# Patient Record
Sex: Male | Born: 1960
Health system: Southern US, Community
[De-identification: ages and names within clinical notes are randomized; demographics above are authoritative.]

## PROBLEM LIST (undated history)

## (undated) DIAGNOSIS — R42 Dizziness and giddiness: Secondary | ICD-10-CM

## (undated) DIAGNOSIS — S0990XA Unspecified injury of head, initial encounter: Secondary | ICD-10-CM

## (undated) DIAGNOSIS — F0781 Postconcussional syndrome: Secondary | ICD-10-CM

## (undated) DIAGNOSIS — Z789 Other specified health status: Secondary | ICD-10-CM

## (undated) DIAGNOSIS — E785 Hyperlipidemia, unspecified: Secondary | ICD-10-CM

## (undated) HISTORY — PX: NO PAST SURGERIES: SHX2092

## (undated) HISTORY — DX: Hyperlipidemia, unspecified: E78.5

## (undated) HISTORY — PX: KNEE SURGERY: SHX244

## (undated) HISTORY — DX: Other specified health status: Z78.9

---

## 1998-07-24 ENCOUNTER — Emergency Department (HOSPITAL_COMMUNITY): Admission: EM | Admit: 1998-07-24 | Discharge: 1998-07-24 | Payer: Self-pay | Admitting: Emergency Medicine

## 1999-05-17 ENCOUNTER — Emergency Department (HOSPITAL_COMMUNITY): Admission: EM | Admit: 1999-05-17 | Discharge: 1999-05-17 | Payer: Self-pay

## 2002-02-11 ENCOUNTER — Inpatient Hospital Stay (HOSPITAL_COMMUNITY): Admission: EM | Admit: 2002-02-11 | Discharge: 2002-02-14 | Payer: Self-pay | Admitting: Psychiatry

## 2002-02-11 ENCOUNTER — Encounter: Payer: Self-pay | Admitting: Emergency Medicine

## 2004-08-07 ENCOUNTER — Inpatient Hospital Stay (HOSPITAL_COMMUNITY): Admission: AC | Admit: 2004-08-07 | Discharge: 2004-08-10 | Payer: Self-pay

## 2004-09-02 ENCOUNTER — Ambulatory Visit (HOSPITAL_COMMUNITY): Admission: RE | Admit: 2004-09-02 | Discharge: 2004-09-02 | Payer: Self-pay | Admitting: General Surgery

## 2004-12-08 ENCOUNTER — Emergency Department (HOSPITAL_COMMUNITY): Admission: EM | Admit: 2004-12-08 | Discharge: 2004-12-08 | Payer: Self-pay | Admitting: Emergency Medicine

## 2005-07-22 ENCOUNTER — Emergency Department (HOSPITAL_COMMUNITY): Admission: EM | Admit: 2005-07-22 | Discharge: 2005-07-23 | Payer: Self-pay | Admitting: Emergency Medicine

## 2005-11-15 ENCOUNTER — Encounter: Admission: RE | Admit: 2005-11-15 | Discharge: 2006-02-13 | Payer: Self-pay | Admitting: Neurology

## 2006-09-25 ENCOUNTER — Emergency Department (HOSPITAL_COMMUNITY): Admission: EM | Admit: 2006-09-25 | Discharge: 2006-09-25 | Payer: Self-pay | Admitting: Emergency Medicine

## 2007-09-06 ENCOUNTER — Emergency Department (HOSPITAL_COMMUNITY): Admission: EM | Admit: 2007-09-06 | Discharge: 2007-09-06 | Payer: Self-pay | Admitting: Emergency Medicine

## 2008-01-20 ENCOUNTER — Encounter: Admission: RE | Admit: 2008-01-20 | Discharge: 2008-04-19 | Payer: Self-pay | Admitting: Psychology

## 2010-07-29 ENCOUNTER — Emergency Department (HOSPITAL_COMMUNITY)
Admission: EM | Admit: 2010-07-29 | Discharge: 2010-07-29 | Disposition: A | Discharge status: Home / Self Care | Payer: Medicare Other | Attending: Emergency Medicine | Admitting: Emergency Medicine

## 2010-07-29 DIAGNOSIS — T6391XA Toxic effect of contact with unspecified venomous animal, accidental (unintentional), initial encounter: Secondary | ICD-10-CM | POA: Insufficient documentation

## 2010-07-29 DIAGNOSIS — T63391A Toxic effect of venom of other spider, accidental (unintentional), initial encounter: Secondary | ICD-10-CM | POA: Insufficient documentation
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
## 2011-01-01 ENCOUNTER — Emergency Department (HOSPITAL_COMMUNITY)
Admission: EM | Admit: 2011-01-01 | Discharge: 2011-01-01 | Disposition: A | Discharge status: Home / Self Care | Payer: Medicare Other | Attending: Emergency Medicine | Admitting: Emergency Medicine

## 2011-01-01 ENCOUNTER — Emergency Department (HOSPITAL_COMMUNITY): Payer: Medicare Other

## 2011-01-01 DIAGNOSIS — W1809XA Striking against other object with subsequent fall, initial encounter: Secondary | ICD-10-CM | POA: Insufficient documentation

## 2011-01-01 DIAGNOSIS — S2239XA Fracture of one rib, unspecified side, initial encounter for closed fracture: Secondary | ICD-10-CM | POA: Insufficient documentation

## 2011-01-01 DIAGNOSIS — R079 Chest pain, unspecified: Secondary | ICD-10-CM | POA: Insufficient documentation

## 2011-08-29 ENCOUNTER — Encounter (HOSPITAL_COMMUNITY): Payer: Self-pay

## 2011-08-29 ENCOUNTER — Ambulatory Visit (HOSPITAL_COMMUNITY)
Admission: RE | Admit: 2011-08-29 | Discharge: 2011-08-29 | Disposition: A | Discharge status: Home / Self Care | Payer: Medicare Other | Attending: Psychiatry | Admitting: Psychiatry

## 2011-08-29 ENCOUNTER — Telehealth (HOSPITAL_COMMUNITY): Payer: Self-pay
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
## 2011-09-01 ENCOUNTER — Encounter (HOSPITAL_COMMUNITY): Payer: Self-pay | Admitting: Psychology

## 2011-09-04 ENCOUNTER — Other Ambulatory Visit (HOSPITAL_COMMUNITY): Payer: Medicare Other | Attending: Psychiatry | Admitting: Psychology

## 2011-09-04 DIAGNOSIS — F1920 Other psychoactive substance dependence, uncomplicated: Secondary | ICD-10-CM | POA: Insufficient documentation
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
## 2011-09-06 ENCOUNTER — Other Ambulatory Visit (HOSPITAL_COMMUNITY): Payer: Medicare Other | Admitting: Psychology

## 2011-09-08 ENCOUNTER — Other Ambulatory Visit (HOSPITAL_COMMUNITY): Payer: Medicare Other | Admitting: Psychology

## 2011-09-08 ENCOUNTER — Encounter (HOSPITAL_COMMUNITY): Payer: Self-pay | Admitting: Psychology

## 2011-09-08 NOTE — Progress Notes (Signed)
    Daily Group Progress Note  Program: CD-IOP   Group Time: 1-2:30 pm  Participation Level: Active  Behavioral Response: Appropriate and Sharing  Type of Therapy: Psycho-education Group  Topic: Chaplain: first half of group was spent with the visiting Chaplain. He talked about Hope and the group was asked to share their feelings about hope. The conversation steered to how one finds hope while struggling with a loss of hope. There was good disclosure among group members and sharing about how they have found reasons to go on when there weren't any.   Group Time: 2:45- 4pm  Participation Level: Active  Behavioral Response: Sharing  Type of Therapy: Process Group  Topic: Group Process: the second half of group was spent in process. Members shared their current struggles and issues in early recovery. There was good discussion and feedback among members.  Summary: The patient asked the chaplain what church he attends? Keith Mcdonald was a little confused about the difference between spirituality and religion. He admitted that both his parents are ministers, but he finds them very hypocritical and not real about their supposed faith. In process, the patient shared that he had woken from a nightmare and thought he had relapsed. He assumed that dreaming about smoking crack was a bad thing. The group explained that it isn't bad and all of them have had using dreams. It is typical in early recovery. The patient still associates having thoughts about using as actually using and we will focus on educating him that one doesn't have to act on thoughts. He was active and engaged in the discussion and responded well to this intervention.    Family Program: Family present? No   Name of family member(s):   UDS collected: No Results:   AA/NA attended?: YesTuesday, Thursday, Saturday and Sunday  Sponsor?: Yes   Nyala Kirchner, LCAS

## 2011-09-09 LAB — PRESCRIPTION ABUSE MONITORING 17P, URINE
Benzodiazepine Screen, Urine: NEGATIVE ng/mL
Cannabinoid Scrn, Ur: NEGATIVE ng/mL
Carisoprodol, Urine: NEGATIVE ng/mL
Fentanyl, Ur: NEGATIVE ng/mL
Opiate Screen, Urine: NEGATIVE ng/mL
Oxycodone Screen, Ur: NEGATIVE ng/mL
Propoxyphene: NEGATIVE ng/mL
Tapentadol, urine: NEGATIVE ng/mL

## 2011-09-11 ENCOUNTER — Other Ambulatory Visit (HOSPITAL_COMMUNITY): Payer: Medicare Other | Admitting: Psychology

## 2011-09-11 NOTE — Progress Notes (Signed)
Patient ID: Keith Mcdonald, male   DOB: [birth year], 51 y.o.   MRN: [id number] Treatment Plan Session: I met with the patient at the conclusion of his CD-IOP group this afternoon. He is new to the group, but presents as very comfortable with his fellow group members and shares about himself and his long history of drug use. The patient agreed that remaining alcohol and drug-free is primary. He has a wife and 2 young children and he wants to stop using crack cocaine once and for all. He is agreeable to building his recovery support and attends the 8 am meeting at the [employer] frequently. 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 The treatment plan was completed and signed accordingly. The patient has not been in treatment and needs a complete review of the basics of recovery. He displays good motivation and seems to understand what he needs to do. Will continue to follow closely in the days ahead.

## 2011-09-11 NOTE — Progress Notes (Signed)
    Daily Group Progress Note  Program: CD-IOP   Group Time: 1-2:30 pm  Participation Level: Active  Behavioral Response: Appropriate  Type of Therapy: Psycho-education Group  Topic:Dealing with Cravings: a presentation was provided on dealing with cravings. Experiencing cravings is very typical in early recovery and newly recovering people must learn to deal with them and not succumb to using. Identifying triggers and learning to minimize potential cravings was emphasized, but at other times, cravings just seem to come out of the blue. The time-limited nature of cravings were discussed and members shared what they do when they are craving. There was good disclosure and members provided good feedback to each other.   Group Time: 2:45- 4pm  Participation Level: Active  Behavioral Response: Sharing  Type of Therapy: Process Group  Topic: Group Process: Second half of group was spent in process. Members shared about their current struggles and concerns. Members provided examples of things they have done in certain situations and how they had experienced similar events. There was good feedback among the group.  Summary:The patient shared about some of his past drug use and the guilt he feels about the things he has done in the past. 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 Another member reminded him that no one makes any one else use, people do that on their own. The patient talked about his children and expressed concerns about their future and drugs. This patient is very new to recovery and has not yet developed good coping skills to deal with cravings. He received good feedback and responded well to this intervention.   Family Program: Family present? No   Name of family member(s):   UDS collected: Yes Results:   AA/NA attended?: YesTuesday, Thursday and Saturday  Sponsor?: Yes   Samia Kukla, LCAS

## 2011-09-12 LAB — PRESCRIPTION ABUSE MONITORING 17P, URINE
Barbiturate Screen, Urine: NEGATIVE ng/mL
Benzodiazepine Screen, Urine: NEGATIVE ng/mL
Cannabinoid Scrn, Ur: NEGATIVE ng/mL
Carisoprodol, Urine: NEGATIVE ng/mL
Meperidine, Ur: NEGATIVE ng/mL
Opiate Screen, Urine: NEGATIVE ng/mL
Oxycodone Screen, Ur: NEGATIVE ng/mL
Propoxyphene: NEGATIVE ng/mL

## 2011-09-12 NOTE — Progress Notes (Signed)
    Daily Group Progress Note  Program: CD-IOP   Group Time: 1-2:30 pm  Participation Level: Active  Behavioral Response: Sharing  Type of Therapy: Psycho-education Group  Topic: Open and Honest: the importance of being open and honest in group was discussed at length this afternoon. If one is to make changes and progress towards an abstinence-based lifestyle, it requires the recovering individual to become very open about what he/she is experiencing on a daily basis and being able to express that to others. It was pointed out that while some of the group members are extremely 'transparent', others do not disclose any information of depth. I emphasized that one will only benefit from the group as much as they are willing to give of themselves. The session went quite well with a number of members sharing about themselves to a degree that had been sorely lacking.   Group Time: 2:45- 4pm  Participation Level: Active  Behavioral Response: Appropriate and Sharing  Type of Therapy: Process Group  Topic: Group Process: members shared about their current issues and concerns. One member complained about his family's inability or refusal to acknowledge his progress. This complaint resonated with other group members. Members talked about their triggers and learning how to identify them.   Summary: The patient reported that he remains sober and had a great weekend. In response to another member who expressed frustration about his family's complaints about him, this patient agreed that he feels much the same frustration that seems to suggest that he is the only one with a problem. He talked about triggers and cravings and how attending AA meetings has been very helpful for him. The patient was more open about himself and his history of crack cocaine. He is making progress in the group process and is becoming more transparent and willing to share about his humanness.   Family Program: Family present?  No   Name of family member(s):   UDS collected: Yes Results:not available  AA/NA attended?: YesMonday, Tuesday, Wednesday, Thursday, Friday, Saturday and Sunday  Sponsor?: Yes   Tadeo Besecker, LCAS

## 2011-09-13 ENCOUNTER — Other Ambulatory Visit (HOSPITAL_COMMUNITY): Payer: Medicare Other

## 2011-09-15 ENCOUNTER — Other Ambulatory Visit (HOSPITAL_COMMUNITY): Payer: Medicare Other | Admitting: Psychology

## 2011-09-18 ENCOUNTER — Encounter (HOSPITAL_COMMUNITY): Payer: Self-pay | Admitting: Psychology

## 2011-09-18 ENCOUNTER — Other Ambulatory Visit (HOSPITAL_COMMUNITY): Payer: Medicare Other | Admitting: Psychology

## 2011-09-18 NOTE — Progress Notes (Signed)
    Daily Group Progress Note  Program: CD-IOP   Group Time: 1-2:30 pm  Participation Level: Active  Behavioral Response: Appropriate  Type of Therapy: Activity Group  Topic:Self-Care with Yoga Instructor: first half of group was spent with Jari Favre, the yoga instructor who has led the group in previous sessions. Today her topic was self-care. She discussed the dangers of stress and led exercises and a short meditation focusing on one's breath. She provided handouts on coping skills and affirmations and encouraged the group to read the affirmations daily. The session proved helpful and emphasized the importance of self-care in recovery.   Group Time: 2:45- 4pm  Participation Level: Active  Behavioral Response: Sharing  Type of Therapy: Process Group  Topic: Group Process: second half of group was spent in process. Members discussed their current issues and concerns. There was good feedback among the group and some helpful recommendations and encouragement provided.  Summary: The patient was attentive and engaged in the activities led by the yoga instructor. In process, he shared about some of his frustrations and fears about using crack. He admitted that his wife doesn't seem to understand what recovery really is and is not as supportive as he would prefer. The patient offered good feedback to his fellow group members and is working towards being as open and transparent as he is able. He responded well to this intervention.  Family Program: Family present? No   Name of family member(s):   UDS collected: No Results:   AA/NA attended?: YesMonday, Tuesday, Wednesday, Thursday, Friday, Saturday and Sunday  Sponsor?: Yes   Matisse Roskelley, LCAS

## 2011-09-19 NOTE — Progress Notes (Signed)
    Daily Group Progress Note  Program: CD-IOP   Group Time: 1-2:30 pm  Participation Level: Active  Behavioral Response: Appropriate  Type of Therapy: Process Group  Topic: Check-in and Group process. First part of group spent in process. Members shared about their weekend and this brought up discussion about elements about recovery that had occurred over the weekend. A former member arrived and introduced herself and described how she has remained abstinent for the past 6 months. The patient was very open about her addiction and how she has changed since her introduction to recovery. The session proved very informative and was delivered from a former group member.  Group Time: 2:45- 4pm  Participation Level: Active  Behavioral Response: Appropriate and Sharing  Type of Therapy: Psycho-education Group  Topic: Codependency: a presentation was provided on the topic of codependency. Handouts were provided that included definitions and examples of codependent behaviors. A lively discussion ensued with group members providing vivid examples of codependent behaviors that either they or their partner has displayed. Two role-plays were provided with good feedback from observing group members. There was good discussion and personal examples that brought laughter and lightness to the session.  Summary: The patient reported he had had a good weekend. He had gone to [employer] with his children and attended church on Sunday. He admitted that there are times when he has cravings, but he keeps himself busy and has learned to do other things and take his attention away from the cravings. When discussing codependency, he talked about how his wife and even his children have frequently questioned where he is going? The patient admitted that sometimes his children would insist that they needed to go with him and he now realized they didn't want him leaving by himself and going out to smoke crack. He  also shared about giving his wife many gifts and how she didn't seem to appreciate them and this just made him angry and want to get high. This brought up the discussion of the 5 love languages and he was able to see that her love language was not gifts, but quality time. The patient is making good progress in his recovery.   Family Program: Family present? No   Name of family member(s):   UDS collected: No Results:  AA/NA attended?: YesMonday, Tuesday, Wednesday, Thursday, Friday, Saturday and Sunday  Sponsor?: Yes   [name], LCAS

## 2011-09-19 NOTE — Progress Notes (Signed)
Patient ID: Keith Mcdonald, male   DOB: [birth year], 51 y.o.   MRN: [id number] Individual Therapy Session: I met with the patient this morning for an individual therapy session. The patient entered the CD-IOP on April 8th and has excellent attendance with 7 sessions attended. We discussed his daily recovery plan. I reviewed triggers - external and internal - and he was able to identify those that are most prevalent in his life. They include certain parts of town, paraphernalia to use crack, and anger as his most powerful emotional trigger. The patient shared that he attends daily AA meetings and phones his sponsor daily. He begins and ends his day in prayer and has a strong Research scientist (medical). He attends church every Sunday and his higher power is the [employer]. He is not currently driving, but walks or takes the bus everywhere. He displays good motivation and seems to have good insight into his addictive self. The patient has a number of 'hoops to jump through', including Drug Court and [facility]. I encouraged him to continue to be open in group and work towards total transparency in his life. The patient continues to make excellent progress and responded well to this intervention.

## 2011-09-19 NOTE — Progress Notes (Incomplete)
Patient ID: Keith Mcdonald, male   DOB: 1961/01/03, 51 y.o.   MRN: [id number] Individual therapy session for CD-IOP. I met with patient this morning. He had phoned and misunderstood his appt was actually on Wednesday. I had an opening and asked him to come ahead.

## 2011-09-20 ENCOUNTER — Other Ambulatory Visit (HOSPITAL_COMMUNITY): Payer: Medicare Other | Admitting: Psychology

## 2011-09-21 LAB — PRESCRIPTION ABUSE MONITORING 17P, URINE
Carisoprodol, Urine: NEGATIVE ng/mL
Creatinine, Urine: 202.94 mg/dL (ref >20.0)
Fentanyl, Ur: NEGATIVE ng/mL
MDMA URINE: NEGATIVE ng/mL
Oxycodone Screen, Ur: NEGATIVE ng/mL
Propoxyphene: NEGATIVE ng/mL
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
## 2011-09-22 ENCOUNTER — Other Ambulatory Visit (HOSPITAL_COMMUNITY): Payer: Medicare Other | Admitting: Psychology

## 2011-09-25 ENCOUNTER — Other Ambulatory Visit (HOSPITAL_COMMUNITY): Payer: Medicare Other | Admitting: Psychology

## 2011-09-25 NOTE — Progress Notes (Signed)
    Daily Group Progress Note  Program: CD-IOP   Group Time: 1-2:30 pm  Participation Level: Active  Behavioral Response: Appropriate  Type of Therapy: Activity Group  Topic: Yoga: Fist half of group session today was spent in a yoga class in the gym. Members were led by Jari Favre, a certified yoga instructor. Two group members have had back problems and she altered the positions to accommodate their personal needs. There was good engagement and participation among the group and the session proved effective.  Group Time: 2:45- 4pm  Participation Level: Active  Behavioral Response: Sharing  Type of Therapy: Process Group  Topic: Group Process and Graduation: second half of group was spent in process. The session was held out in the courtyard because it was a warm beautiful day. Members shared about their current struggles in early recovery. Near the conclusion of the session, a graduation ceremony was held for a successfully graduating group member. She became teary as she recounted the benefits and importance of this group to her recovery.   Summary:The patient has lower back issues, but the instructor was able to alter the exercises to accommodate his particular problem. This patient had never done yoga before and laughed with other members as they assumed positions that he had never before attempted. The patient reported he really enjoyed the yoga class. In process, he reported he continues to make good progress in his recovery. He is meeting all of his [facility] requirements and attending AA meetings daily. The patient is doing well and is gaining good insight into his own triggers and relationship with drugs. He responded well to this intervention.   Family Program: Family present? No   Name of family member(s):   UDS collected: No Results:   AA/NA attended?: YesMonday, Tuesday, Wednesday, Thursday, Friday, Saturday and Sunday  Sponsor?: Yes   Shacoya Burkhammer,  LCAS

## 2011-09-26 NOTE — Progress Notes (Signed)
    Daily Group Progress Note  Program: CD-IOP   Group Time: 1-2:30 pm  Participation Level: Active  Behavioral Response: Sharing  Type of Therapy: Activity Group  Topic: "People I Admire": First part of group spent in a strength-building exercise. Members were asked to write down 5 people they admire and the 3 characteristics or attributes that they possess for which they are to be admired. Members shared these and those identified included mostly family members, friends and neighbors and 1 rock star. The underlying meaning of this exercise is the belief that to identify a trait in someone else requires that the identifying person has the same trait - one their "best day". Members seemed to agree that many of the traits identified did belong to those identifying them. There was good sharing among the group during this session.   Group Time: 2:45- 4pm  Participation Level: Active  Behavioral Response: Sharing  Type of Therapy: Process Group  Topic:Process: second half of group was spent in process. Members shared their current issues and concerns. The group had a new member and he shared about himself. He had come directly from detox and most of the members had had detox experiences themselves. The group was very gentle with this new member and provided good support to him as he talked about his life and long term alcoholism.    Summary:The patient reported he had had a good weekend. He explained he had photographed a cousin's wedding and proved he was "reliable", something he would never have been considered in his previous life. [name] reported he could relate to the new group member and his struggle with his addiction. [name] provided good feedback and shared that he is working hard to be completely open about his feelings in groups and AA meetings. He made some good comments.   Family Program: Family present? No   Name of family member(s):   UDS collected: No Results:   AA/NA  attended?: YesMonday, Tuesday, Wednesday, Thursday, Friday, Saturday and Sunday  Sponsor?: Yes   Keith Mcdonald, LCAS

## 2011-09-27 ENCOUNTER — Other Ambulatory Visit (HOSPITAL_COMMUNITY): Payer: Medicare Other | Attending: Psychiatry | Admitting: Psychology

## 2011-09-27 DIAGNOSIS — F1920 Other psychoactive substance dependence, uncomplicated: Secondary | ICD-10-CM

## 2011-09-28 LAB — PRESCRIPTION ABUSE MONITORING 17P, URINE
Amphetamine/Meth: NEGATIVE ng/mL
Barbiturate Screen, Urine: NEGATIVE ng/mL
Cannabinoid Scrn, Ur: NEGATIVE ng/mL
Carisoprodol, Urine: NEGATIVE ng/mL
Cocaine Metabolites: NEGATIVE ng/mL
MDMA URINE: NEGATIVE ng/mL
Meperidine, Ur: NEGATIVE ng/mL
Opiate Screen, Urine: NEGATIVE ng/mL
Oxycodone Screen, Ur: NEGATIVE ng/mL
Propoxyphene: NEGATIVE ng/mL
Tramadol Scrn, Ur: NEGATIVE ng/mL

## 2011-09-28 NOTE — Progress Notes (Signed)
    Daily Group Progress Note  Program: CD-IOP   Group Time: 1-2:30 pm  Participation Level: Active  Behavioral Response: Appropriate  Type of Therapy: Psycho-education Group  Topic: Changing a Habit: the first half of group spent discussing the 3 stage process of changing a habit. This is a perspective developed by and [facility]. Group members shared about the changes they have made and agreed that making the necessary behavioral changes in their daily lives wasn't easy. One of the new members admitted all he has done for the past 10 years is work and drink. He is going to have to find some new hobbies. There was good sharing and discussion among the group members. Drug tests were collected today.   Group Time: 2:45-4pm  Participation Level: Active  Behavioral Response: Sharing  Type of Therapy: Process Group  Topic: Group Process: second half of group was spent in process. Members discussed their current struggles and issues. One member shared that his wife had accused him of drinking last night while helping out with his 50 yo Environmental consultant. He cried as he recounted how much this had hurt him. This generated a lengthy discussion about trust and the difficulties and frustrations that occur naturally as one's loved ones are slow to regain trust. The new group member introduced herself and told about her lengthy use of drugs and alcohol. She shared details about her 23 yo daughter who is severely disabled and cried as she recounted the prognosis and how the daughter has already outlived the initial projections. There was good feedback and support provided by the group members.   Summary: the patient reported he had gotten mad at his wife and wanted to go get high. When asked to share the details of this incident, Kyen reported he got mad at this wife and proceeded to grow more angry as he thought about when she took the 50-B out on her. When I asked him when this had  occurred, he reported she had taken this legal action 10 years ago. The group pointed out that he had been carrying a reservation that could become a resentment and justify going out and smoking crack. The patient quickly agreed with this explanation and recognized how he had been holding onto this for many years. The patient agreed with another member about the frustration he feels when questioned by his wife. Another group member pointed out that Theoplis has been smoking crack and getting high for 30+ years and has about 30 days of sobriety. The patient was encouraged to be more patient with his family and reminded that from their perspective, he has a pretty poor track record. The patient was responsive to feedback and made some good comments.    Family Program: Family present? No   Name of family member(s):   UDS collected: Yes Results: not yet back  AA/NA attended?: YesMonday, Tuesday, Wednesday, Thursday, Friday, Saturday and Sunday  Sponsor?: Yes   Gonsalo Cuthbertson, LCAS

## 2011-09-29 ENCOUNTER — Other Ambulatory Visit (HOSPITAL_COMMUNITY): Payer: Medicare Other | Admitting: Psychology

## 2011-10-02 ENCOUNTER — Other Ambulatory Visit (HOSPITAL_COMMUNITY): Payer: Medicare Other | Admitting: *Deleted

## 2011-10-03 NOTE — Progress Notes (Signed)
    Daily Group Progress Note  Program: CD-IOP   Group Time: 1:00- 2:00  Participation Level: Active  Behavioral Response: Appropriate and Sharing  Type of Therapy: Process Group  Topic:Group process time was spent discussing triggers and learning to live in the awkwardness of early sobriety. All the pts were able to relate to feeling uncomfortable and unsure during the first few weeks of recovery. Keith Mcdonald shared with the group about the triggers he has and the things that used to bother him more and do not affect him as much now. Pt took his children to see a home he built that would have been a trigger for him in the past but wasn't now. He is open and honest about his challenges.      Group Time: 2:00- 3:15  Participation Level: Active  Behavioral Response: Appropriate and Sharing  Type of Therapy: Psycho-education Group  Topic: Distorted Thinking   Summary: The group members read and reacted to a worksheet discussing distorted thinking patterns. Keith Mcdonald was able to relate to a number of them and was honest with the group about them. He shared about his challenges with his mother expecially and brought up a good conversation about coping skills and strategies for handling anger and other negative emotions.    Family Program: Family present? No   Name of family member(s): na  UDS collected: No Results: negative  AA/NA attended?: Yes  Sponsor?: No   Keith Mcdonald, [name], COUNS

## 2011-10-04 ENCOUNTER — Other Ambulatory Visit (HOSPITAL_COMMUNITY): Payer: Medicare Other | Admitting: Psychology

## 2011-10-04 ENCOUNTER — Encounter (HOSPITAL_COMMUNITY): Payer: Self-pay | Admitting: Psychology

## 2011-10-04 DIAGNOSIS — F1920 Other psychoactive substance dependence, uncomplicated: Secondary | ICD-10-CM

## 2011-10-04 NOTE — Progress Notes (Signed)
    Daily Group Progress Note  Program: CD-IOP   Group Time: 1-2:30 pm   Participation Level: Active  Behavioral Response: Appropriate  Type of Therapy: Psycho-education Group  Topic: Triggers and Cravings: first half of group was spent in a presentation on identifying triggers  and dealing with cravings. Members shared about their own triggers and those identified varied considerably among group members. The presentation included identifying internal and external triggers and how to address them. There was good disclosure and discussion and at the conclusion of the presentation, each member was able to identify an internal and external trigger and the subsequent cravings they generate.   Group Time: 2:45-4 pm  Participation Level: Active  Behavioral Response: Sharing  Type of Therapy: Process Group  Topic: Group Process: the second half of group was spent in process. Members talked about their current issues and struggles in early recovery. There was good feedback provided and members were encouraged to vent their feelings in this session.   Summary: the patient reported he knows his triggers real well and they are people and his old drug using neighborhood. He also reported that anger was the most powerful internal trigger. He reported he was doing well and following through on all of his requirements, including drug court and [facility]. 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 The patient displays a growing understanding of his daily recovery needs and continues to attend 1-2 AA meetings per day. He responded well to this intervention.   Family Program: Family present? No   Name of family member(s):   UDS collected: No Results:   AA/NA attended?: YesMonday, Tuesday, Wednesday, Thursday, Friday, Saturday and Sunday  Sponsor?: Yes   Nickoles Gregori, LCAS

## 2011-10-05 LAB — PRESCRIPTION ABUSE MONITORING 17P, URINE
Amphetamine/Meth: NEGATIVE ng/mL
Barbiturate Screen, Urine: NEGATIVE ng/mL
Benzodiazepine Screen, Urine: NEGATIVE ng/mL
Carisoprodol, Urine: NEGATIVE ng/mL
Cocaine Metabolites: NEGATIVE ng/mL
Creatinine, Urine: 171.6 mg/dL (ref >20.0)
Meperidine, Ur: NEGATIVE ng/mL
Propoxyphene: NEGATIVE ng/mL

## 2011-10-05 LAB — ALCOHOL METABOLITE (ETG), URINE: Ethyl Glucuronide (EtG): NEGATIVE ng/mL

## 2011-10-05 NOTE — Progress Notes (Signed)
    Daily Group Progress Note  Program: CD-IOP   Group Time: 1-2:30 pm  Participation Level: Active  Behavioral Response: Appropriate  Type of Therapy: Psycho-education Group  Topic:  Psycho-Social-Spiritual: first half of group spent in a psych-educational session on the essential elements that contribute to addiction. There was good discussion among members and they identified the various aspects of these 4 elements that led to their crossing the line into addiction. In each instance, the member's disclosure validated this understanding of addiction.   Group Time: 2:45- 4pm  Participation Level: Active  Behavioral Response: Sharing  Type of Therapy: Process Group  Topic: Process, Step One, and Intro: the second half of the session was spent in process. One member read his "Step One" and this invited response. There were 2 new group members and they were invited to share with the group about their addiction and what they are looking for in this program. There was good disclosure and feedback among members and the new members received a warm welcome.  Summary: the patient noted that there was a lot of addiction in his family and all of his siblings are also addicts. He reported that when he was in high school he played all the sports, but he liked hanging out with the guys that were drinking and smoking pot and gradually he spent more time with them and less in sports. In process, the patient recounted an episode with his wife early this morning. He had recognized that perhaps visiting with his old drug dealer had planted a thought of using which generated a rather loud argument with his wife which included lots of profanity. The patient reported he and his and two kids were going to meet for a session at the conclusion of group on Friday. He admitted he had nothing to hide and wanted to be completely open, but his old addictive self always lied and manipulated. The patient continues to  make excellent progress in his recovery.   Family Program: Family present? No   Name of family member(s):   UDS collected: Yes Results: negative  AA/NA attended?: YesMonday, Tuesday, Wednesday, Thursday, Friday, Saturday and Sunday  Sponsor?: Yes   Keith Mcdonald, LCAS

## 2011-10-06 ENCOUNTER — Other Ambulatory Visit (HOSPITAL_COMMUNITY): Payer: Medicare Other | Admitting: Psychology

## 2011-10-06 NOTE — Progress Notes (Signed)
Patient ID: Keith Mcdonald, male   DOB: [birth year], 51 y.o.   MRN: [id number] Treatment Plan Update: met with the patient prior to his CD-IOP group session. He reported he had had an argument with his wife this morning and it had been very heated. We talked about his tendency to escalate in his anger very quickly and how he frequently says in appropriate things before he has a chance to think. [name] admitted that he couldn't really explain why he had gotten so mad, but it quickly escalated into profanity and yelling. We agreed we would meet with his wife, Judeth Cornfield, and discuss their interaction habits. I explained the need to review his treatment goals and we discussed them at length. The patient will pick up his 60 day chip on Friday. He is also building a strong network of recovery support and attending AA daily, usually at least 2 meetings per day. He also has a sponsor and is working the steps. Relative to his legal problems, the patient remains compliant with probation, drug court, and [facility] and has continued to appear for every scheduled appointment and provide all negative UA's. I congratulated him on how well he has done and how committed he is to an abstinence-based lifestyle. The patient is an excellent group member and is becoming more open and recognizing the benefits of "telling on himself". Documentation was reviewed, signatures obtained, and the treatment plan update was successfully completed.

## 2011-10-09 ENCOUNTER — Other Ambulatory Visit (HOSPITAL_COMMUNITY): Payer: Medicare Other | Admitting: Psychology

## 2011-10-09 NOTE — Progress Notes (Addendum)
    Daily Group Progress Note  Program: CD-IOP   Group Time: 1-2:30 pm  Participation Level: Active  Behavioral Response: Appropriate  Type of Therapy: Psycho-education Group  Topic:  Giving and Receiving Constructive Criticism: A presentation was provided on giving constructive criticism; both how to give it and how to receive it. The reason for this topic was the recognition that interpersonal relationships are one of the primary reasons for relapse. It stands to reason that improved communication will enhance or promote better relationships and support sobriety. Handouts were provided and group members shared about their own experiences in dealing with criticism.     Group Time: 2:45-4pm  Participation Level: Active  Behavioral Response: Appropriate and Sharing  Type of Therapy: Process Group  Topic: Group Process and Good-bye: The second half of group was spent in process. Members shared about their current struggles and issues in early recovery. The session had a new group member and was accompanied by his wife. The new group member introduced himself and his wife and shared about his history as his new group members questioned him about his alcohol use. As the session neared the end, one of the members said her good-bye to her fellow group members. She explained that she had decided to return to work because her FMLA had run out and her supervisor had encouraged her to return to work to avoid any problems with upcoming layoffs. The patient shared some kind words and expressed her gratitude for the support and encouragement provided her during her time in the program.  Summary: The patient reported he and his wife had had a big blowup yesterday and he admitted that he needs to learn how to give and receive "constructive" criticism. He admitted he tends to get mad easily and quickly and, in this case, he and his wife both escalated together and it became very unpleasant - particularly  since their two children were present and observed the entire exchange. Keith Mcdonald was very open about his tendencies, but emphasized that he isn't taking what the group is telling him personally and that he really does what to change and needs their help. One suggestion for him included agreeing never to raise his voice or allow the volume to get higher than a normal conversation. He agreed to become more aware of this. In process, the patient reported he as going to church and then take his mother-in-law, wife and children out for lunch. The patient continues to make good progress in his early recovery. He was applauded as he announced that he had picked up his 60 day chip earlier today at the 8 am [employer]. He is really doing well.   Family Program: Family present? No   Name of family member(s):   UDS collected: No Results:   AA/NA attended?: YesMonday, Tuesday, Wednesday, Thursday, Friday, Saturday and Sunday  Sponsor?: Yes   Keith Mcdonald, LCAS

## 2011-10-11 ENCOUNTER — Other Ambulatory Visit (HOSPITAL_COMMUNITY): Payer: Medicare Other | Admitting: Psychology

## 2011-10-11 DIAGNOSIS — F1920 Other psychoactive substance dependence, uncomplicated: Secondary | ICD-10-CM

## 2011-10-11 NOTE — Progress Notes (Signed)
    Daily Group Progress Note  Program: CD-IOP   Group Time: 1-2:30 pm  Participation Level: Minimal  Behavioral Response: Appropriate  Type of Therapy: Psycho-education Group  Topic: Guest Speaker: first half of group included a guest speaker. [name] had been in the CD-IOP last year and had contacted me about coming in and speaking with the group. He shared about his long history of alcohol and drug use and the many painful consequences of his addiction. The group appeared spellbound by his story and asked some good questions at the conclusion of his talk.   Group Time: 2:45-4 pm  Participation Level: Active  Behavioral Response: Appropriate and Sharing  Type of Therapy: Process Group  Topic:Group Process: second half of group spent in process. Members shared about their current struggles and issues in early recovery. One of the newer members shared that she had drank over the weekend at her daughter's college graduation. This "slip" was discussed and reviewed and length with various alternatives offered. There was good disclosure and feedback offered among group members.   Summary: The patient was attentive during the presentation by the guest speaker. He welcomed [name] upon his arrival and explained he had met [name] in [employer] when he first entered recovery, but had never heard his story. In process, the patient was very gentle with the member who disclosed about her 'slip', but he was firm about this experience with drinking would only lead to more in the future. He could verify this by his own experience thinking it could be different. The patient reported he had taken his family and stopped in at his parent's home yesterday, which was Mother's Day. He stated that he feels compelled to see his mother on this special day, but they left after his mother made an inappropriate comment. The patient continues to make good progress in his recovery and responded well to this  intervention.   Family Program: Family present? No   Name of family member(s):   UDS collected: No Results:   AA/NA attended?: YesMonday, Tuesday, Wednesday, Thursday, Friday, Saturday and Sunday  Sponsor?: Yes   [place] Silveria, LCAS

## 2011-10-12 LAB — PRESCRIPTION ABUSE MONITORING 17P, URINE
Amphetamine/Meth: NEGATIVE ng/mL
Benzodiazepine Screen, Urine: NEGATIVE ng/mL
Buprenorphine, Urine: NEGATIVE ng/mL
Cannabinoid Scrn, Ur: NEGATIVE ng/mL
Creatinine, Urine: 281.11 mg/dL (ref >20.0)
Fentanyl, Ur: NEGATIVE ng/mL
MDMA URINE: NEGATIVE ng/mL
Meperidine, Ur: NEGATIVE ng/mL
Methadone Screen, Urine: NEGATIVE ng/mL

## 2011-10-12 NOTE — Progress Notes (Signed)
    Daily Group Progress Note  Program: CD-IOP   Group Time: 1-2:30 pm  Participation Level: Active  Behavioral Response: Appropriate  Type of Therapy: Psycho-education Group  Topic: Step One: first half of group was spent discussing Step One. One member read her Step One and the group provided feedback and shared their own experiences. There was good disclosure and discussion around this topic.  Group Time: 2:45- 4pm  Participation Level: Active  Behavioral Response: Appropriate and Sharing  Type of Therapy: Process Group  Topic: Group Process and Graduation: second half of group was spent in process. Members discussed some of their current issues and concerns. One member talked about her parents and their disapproval of her boyfriend and the problems this has caused her. Others talked about their own relationships with family or loved ones and the complicated dynamics that these entail. Near the conclusion of the session a graduation was held in honor of a successfully graduating member. There were kind words of encouragement and hope as this member leaves the program.   Summary: The patient was attentive and shared openly in session. He listened carefully to the member sharing her Step One and admitted he could relate to many of her disclosures and experiences. In process, the patient admitted that he overreacts to comments and criticism and continues to work on this problem. His fellow group members assured him that he was making good progress. The patient had kind words for the graduating member and admitted that he was the first man he had ever reached out to in his early sobriety. The patient is working hard to make changes in his life and is making good progress in his recovery.  Family Program: Family present? No   Name of family member(s):   UDS collected: Yes Results: not back yet  AA/NA attended?: YesMonday, Tuesday, Wednesday, Thursday, Friday, Saturday and Sunday  Sponsor?: Yes   Adrian Specht, LCAS

## 2011-10-13 ENCOUNTER — Other Ambulatory Visit (HOSPITAL_COMMUNITY): Payer: Medicare Other | Admitting: Psychology

## 2011-10-16 ENCOUNTER — Other Ambulatory Visit (HOSPITAL_COMMUNITY): Payer: Medicare Other | Admitting: Psychology

## 2011-10-16 DIAGNOSIS — F1920 Other psychoactive substance dependence, uncomplicated: Secondary | ICD-10-CM

## 2011-10-16 NOTE — Progress Notes (Signed)
    Daily Group Progress Note  Program: CD-IOP   Group Time: 1-2:30 pm  Participation Level: Active  Behavioral Response: Appropriate and Sharing  Type of Therapy: Process Group  Topic: Group Process: First half of group was spent in process. Members discussed how things were going in their lives and what their current issues and concerns are. One member had 'used' since the last session and there was considerable time spent discussing her slip and the insanity of addiction, the tendency to want to use "one more time" and the seriousness of a slip. There was a new member in group today and she was asked to introduce herself and explain what she is needing from the program. She received good feedback from the group and shared openly in her first session. During the session, the medical director met with a number of members for follow-up.  Group Time: 2:45- 4pm  Participation Level: Active  Behavioral Response: Sharing  Type of Therapy: Psycho-education Group  Topic: Self-Concept in Early Recovery: Second half of group was spent discussion the way one's sense of self changes upon entering recovery. A handout was provided requesting that group members write down: 1) how they felt about themselves 6 months ago, 2) how they feel about themselves now, and 3) how do you want to feel about yourself. There was a marked improvement for almost all members from question #1 to #2. At the same time, there was still hope that things would improve in the future and no one is where they, eventually, hope to be. There was good disclosure among members.  Summary: The patient was very concerned about his fellow group member's relapse and pointed out he had done the same thing.many times. He emphasized the importance of attending AA meetings and he noted that being in the rooms haschanged him dramatically, but it isn't always obvious. This patient noted that someone with good sobriety had reminded him he had  nothing to offer anyone because he didn't have any sobriety or know anything about it. He was instructed to listen and he emphasized the same for this woman disclosing about her alcohol use. The patient reported he is feeling much better about himself now than he was 6 months ago. He remains compliant in Drug Court and probation. He made some good comments and provided excellent feedback to fellow group members.    Family Program: Family present? No   Name of family member(s):   UDS collected: No Results:  AA/NA attended?: YesMonday, Tuesday, Wednesday, Thursday, Friday, Saturday and Sunday  Sponsor?: Yes   Tilden Broz, LCAS

## 2011-10-17 ENCOUNTER — Emergency Department (HOSPITAL_COMMUNITY)
Admission: EM | Admit: 2011-10-17 | Discharge: 2011-10-17 | Disposition: A | Discharge status: Home / Self Care | Payer: Medicare Other | Attending: Emergency Medicine | Admitting: Emergency Medicine

## 2011-10-17 ENCOUNTER — Encounter (HOSPITAL_COMMUNITY): Payer: Self-pay | Admitting: *Deleted

## 2011-10-17 ENCOUNTER — Emergency Department (HOSPITAL_COMMUNITY): Payer: Medicare Other

## 2011-10-17 DIAGNOSIS — R079 Chest pain, unspecified: Secondary | ICD-10-CM | POA: Insufficient documentation

## 2011-10-17 DIAGNOSIS — F17200 Nicotine dependence, unspecified, uncomplicated: Secondary | ICD-10-CM | POA: Diagnosis not present

## 2011-10-17 DIAGNOSIS — R071 Chest pain on breathing: Secondary | ICD-10-CM | POA: Diagnosis not present

## 2011-10-17 HISTORY — DX: Unspecified injury of head, initial encounter: S09.90XA

## 2011-10-17 HISTORY — DX: Postconcussional syndrome: F07.81

## 2011-10-17 LAB — PRESCRIPTION ABUSE MONITORING 17P, URINE
Barbiturate Screen, Urine: NEGATIVE ng/mL
Benzodiazepine Screen, Urine: NEGATIVE ng/mL
Buprenorphine, Urine: NEGATIVE ng/mL
Cannabinoid Scrn, Ur: NEGATIVE ng/mL
Creatinine, Urine: 185.44 mg/dL (ref >20.0)
MDMA URINE: NEGATIVE ng/mL
Methadone Screen, Urine: NEGATIVE ng/mL
Oxycodone Screen, Ur: NEGATIVE ng/mL
Tapentadol, urine: NEGATIVE ng/mL
Zolpidem, Urine: NEGATIVE ng/mL

## 2011-10-17 LAB — CBC
MCH: 29.1 pg (ref 26.0–34.0)
MCV: 85 fL (ref 78.0–100.0)
Platelets: 311 10*3/uL (ref 150–400)
RDW: 13.9 % (ref 11.5–15.5)
WBC: 7.1 10*3/uL (ref 4.0–10.5)

## 2011-10-17 LAB — BASIC METABOLIC PANEL
Calcium: 8.3 mg/dL — ABNORMAL LOW (ref 8.4–10.5)
Creatinine, Ser: 1.06 mg/dL (ref 0.50–1.35)
GFR calc Af Amer: >90 mL/min (ref >90)

## 2011-10-17 LAB — PRO B NATRIURETIC PEPTIDE: Pro B Natriuretic peptide (BNP): 32.9 pg/mL (ref 0–125)

## 2011-10-17 LAB — ALCOHOL METABOLITE (ETG), URINE: Ethyl Glucuronide (EtG): NEGATIVE ng/mL

## 2011-10-17 MED ORDER — ASPIRIN 325 MG PO TABS
325.0000 mg | ORAL_TABLET | ORAL | Status: AC
  Administered 2011-10-17: 325 mg via ORAL
  Filled 2011-10-17: qty 1

## 2011-10-17 MED ORDER — NITROGLYCERIN 0.4 MG SL SUBL
0.4000 mg | SUBLINGUAL_TABLET | SUBLINGUAL | Status: DC | PRN
  Administered 2011-10-17: 0.4 mg via SUBLINGUAL
  Filled 2011-10-17: qty 25

## 2011-10-17 NOTE — ED Notes (Signed)
Pt reports this chest pain started approximately 15-20 after having sex tonight. Pt denies taking any drugs for ED. Pt did not take aspirin prior to coming here.

## 2011-10-17 NOTE — ED Notes (Signed)
Per pt. Pt was lying in bed when he had a sudden on set of sharp chest pain on his left side. Pt denies pain radiating to his left arm, face, or jaw. Pt denies any shortness of breath, nausea, vomiting, or dizziness. Pt describes his pain now as a dull constant pain on his left side. Pt currently smokes a half a pack a day

## 2011-10-17 NOTE — Progress Notes (Signed)
    Daily Group Progress Note  Program: CD-IOP   Group Time: 1-2:30 pm  Participation Level: Active  Behavioral Response: Appropriate  Type of Therapy: Psycho-education Group  Topic: Events, thoughts, feelings and behavior:  a presentation was provided explaining the process of experiencing an event, having a thought about it, a feeling resulting from this thought and subsequent behavior. The emphasis was on teaching the group members that their thoughts lead or produce positive or negative feelings and usually corresponding behaviors. Members were encouraged to consider why they are feeling as they are and challenge whether their thoughts are legitimate and accurate before acting on them. Some members grasped the concept quickly, while others struggled with the idea that events don't automatically cause feelings. Various examples were provided which aided the group in understanding this process and how it frequently gets distorted with many inaccurate interpretations and behaviors.   Group Time: 2:45-4 pm  Participation Level: Active  Behavioral Response: Sharing  Type of Therapy: Process Group  Topic: Group Process: the second half of group was spent in process. Members discussed their current issues and concerns. There was good feedback and disclosure among the group and the session included some good discussion.   Summary: The patient was attentive and engaged in the discussion on thoughts and feelings. At first, Miki disrupted these claims and insisted that feelings come out of experiences. I used an example that the patient was able to understand to assist in how one's thoughts or interpretations of events directly determine, to some degree, one's feelings. This presentation is particularly aimed at this patient who tends to react before thinking things through and often times regrets his impulsive behaviors. In process, Rosaire provided good feedback to another group member who was  describing her parents and the harsh and demeaning manner in which they speak to her. The patient continues to make good progress in his recovery.   Family Program: Family present? No   Name of family member(s):   UDS collected: Yes Results: not returned  AA/NA attended?: YesMonday, Tuesday, Wednesday, Thursday, Friday, Saturday and Sunday  Sponsor?: Yes   Ema Hebner, LCAS

## 2011-10-17 NOTE — Discharge Instructions (Signed)
Please follow up with your doctor and/or cardiology for recheck in 1-2 days.  Return to the ER for worsening condition or new concerning symptoms.  Take an aspirin, 81 milligrams once a day.  You should have a stress test done given your symptoms.  Aspirin and Your Heart Aspirin affects the way your blood clots and helps "thin" the blood. Aspirin has many uses in heart disease. It may be used as a primary prevention to help reduce the risk of heart related events. It also can be used as a secondary measure to prevent more heart attacks or to prevent additional damage from blood clots.  ASPIRIN MAY HELP IF YOU:  Have had a heart attack or chest pain.   Have undergone open heart surgery such as CABG (Coronary Artery Bypass Surgery).   Have had coronary angioplasty with or without stents.   Have experienced a stroke or TIA (transient ischemic attack).   Have peripheral vascular disease (PAD).   Have chronic heart rhythm problems such as atrial fibrillation.   Chest Pain (Nonspecific) It is often hard to give a specific diagnosis for the cause of chest pain. There is always a chance that your pain could be related to something serious, such as a heart attack or a blood clot in the lungs. You need to follow up with your caregiver for further evaluation. CAUSES   Heartburn.   Pneumonia or bronchitis.   Anxiety or stress.   Inflammation around your heart (pericarditis) or lung (pleuritis or pleurisy).   A blood clot in the lung.   A collapsed lung (pneumothorax). It can develop suddenly on its own (spontaneous pneumothorax) or from injury (trauma) to the chest.   Shingles infection (herpes zoster virus).  The chest wall is composed of bones, muscles, and cartilage. Any of these can be the source of the pain.  The bones can be bruised by injury.   The muscles or cartilage can be strained by coughing or overwork.   The cartilage can be affected by inflammation and become sore  (costochondritis).  DIAGNOSIS  Lab tests or other studies, such as X-rays, electrocardiography, stress testing, or cardiac imaging, may be needed to find the cause of your pain.  TREATMENT   Treatment depends on what may be causing your chest pain. Treatment may include:   Acid blockers for heartburn.   Anti-inflammatory medicine.   Pain medicine for inflammatory conditions.   Antibiotics if an infection is present.   You may be advised to change lifestyle habits. This includes stopping smoking and avoiding alcohol, caffeine, and chocolate.   You may be advised to keep your head raised (elevated) when sleeping. This reduces the chance of acid going backward from your stomach into your esophagus.   Most of the time, nonspecific chest pain will improve within 2 to 3 days with rest and mild pain medicine.  HOME CARE INSTRUCTIONS   If antibiotics were prescribed, take your antibiotics as directed. Finish them even if you start to feel better.   For the next few days, avoid physical activities that bring on chest pain. Continue physical activities as directed.   Do not smoke.   Avoid drinking alcohol.   Only take over-the-counter or prescription medicine for pain, discomfort, or fever as directed by your caregiver.   Follow your caregiver's suggestions for further testing if your chest pain does not go away.   Keep any follow-up appointments you made. If you do not go to an appointment, you could develop lasting (  chronic) problems with pain. If there is any problem keeping an appointment, you must call to reschedule.  SEEK MEDICAL CARE IF:   You think you are having problems from the medicine you are taking. Read your medicine instructions carefully.   Your chest pain does not go away, even after treatment.   You develop a rash with blisters on your chest.  SEEK IMMEDIATE MEDICAL CARE IF:   You have increased chest pain or pain that spreads to your arm, neck, jaw, back, or  abdomen.   You develop shortness of breath, an increasing cough, or you are coughing up blood.   You have severe back or abdominal pain, feel nauseous, or vomit.   You develop severe weakness, fainting, or chills.   You have a fever.  THIS IS AN EMERGENCY. Do not wait to see if the pain will go away. Get medical help at once. Call your local emergency services (911 in [place] Do not drive yourself to the hospital. MAKE SURE YOU:   Understand these instructions.   Will watch your condition.   Will get help right away if you are not doing well or get worse.  Document Released: 02/22/2005 Document Revised: 05/04/2011 Document Reviewed: 12/19/2007 [facility] Patient Information 2012 [place], [place].  Are at risk for heart disease.  BEFORE STARTING ASPIRIN Before you start taking aspirin, your caregiver will need to review your medical history. Many things will need to be taken into consideration, such as:  Smoking status.   Blood pressure.   Diabetes.   Gender.   Weight.   Cholesterol level.  ASPIRIN DOSES  Aspirin should only be taken on the advice of your caregiver. Talk to your caregiver about how much aspirin you should take. Aspirin comes in different doses such as:   81 mg.   162 mg.   325 mg.   The aspirin dose you take may be affected by many factors, some of which include:   Your current medications, especially if your are taking blood-thinners or anti-platelet medicine.   Liver function.   Heart disease risk.   Age.   Aspirin comes in two forms:   Non-enteric-coated. This type of aspirin does not have a coating and is absorbed faster. Non-enteric coated aspirin is recommended for patients experiencing chest pain symptoms. This type of aspirin also comes in a chewable form.   Enteric-coated. This means the aspirin has a special coating that releases the medicine very slowly. Enteric-coated aspirin causes less stomach upset. This type of aspirin should not  be chewed or crushed.  ASPIRIN SIDE EFFECTS Daily use of aspirin can increase your risk of serious side effects, some of these include:  Increased bleeding. This can range from a cut that does not stop bleeding to more serious problems such as stomach bleeding or bleeding into the brain (Intracerebral bleeding).   Increased bruising.   Stomach upset.   An allergic reaction such as red, itchy skin.   Increased risk of bleeding when combined with non-steroidal anti-inflammatory medicine (NSAIDS).   Alcohol should be drank in moderation when taking aspirin. Alcohol can increase the risk of stomach bleeding when taken with aspirin.   Aspirin should not be given to children less than 70 years of age due to the association of Reye syndrome. Reye syndrome is a serious illness that can affect the brain and liver. Studies have linked Reye syndrome with aspirin use in children.   People that have nasal polyps have an increased risk of developing an  aspirin allergy.  SEEK MEDICAL CARE IF:   You develop an allergic reaction such as:   Hives.   Itchy skin.   Swelling of the lips, tongue or face.   You develop stomach pain.   You have unusual bleeding or bruising.   You have ringing in your ears.  SEEK IMMEDIATE MEDICAL CARE IF:   You have severe chest pain, especially if the pain is crushing or pressure-like and spreads to the arms, back, neck, or jaw. THIS IS AN EMERGENCY. Do not wait to see if the pain will go away. Get medical help at once. Call your local emergency services (911 in the [place] DO NOT drive yourself to the hospital.   You have stroke-like symptoms such as:   Loss of vision.   Difficulty talking.   Numbness or weakness on one side of your body.   Numbness or weakness in your arm or leg.   Not thinking clearly or feeling confused.   Your bowel movements are bloody, dark red or black in color.   You vomit or cough up blood.   You have blood in your urine.    You have shortness of breath, coughing or wheezing.  MAKE SURE YOU:   Understand these instructions.   Will monitor your condition.   Seek immediate medical care if necessary.  Document Released: 04/27/2008 Document Revised: 05/04/2011 Document Reviewed: 04/27/2008 [facility] Patient Information 2012 [place], [place].

## 2011-10-17 NOTE — ED Provider Notes (Signed)
History     CSN: [id number]  Arrival date & time 10/17/11  0010   First MD Initiated Contact with Patient 10/17/11 [phone]      Chief Complaint  Patient presents with  . Chest Pain    (Consider location/radiation/quality/duration/timing/severity/associated sxs/prior treatment) HPI 51 year old male presents to emergency room complaining of left-sided chest pain. Patient reports onset of pain around midnight tonight. Patient reports pain felt like a strong cramp Reading from under his left breast around to his back. The pain was sharp in nature sudden in onset, and stabbing. No radiation of the pain, no shortness of breath nausea vomiting dizzy feeling. Pain has now subsided and is dull in nature. Patient reports he is a smoker, smokes one half pack per day. He has no family history of coronary disease. He has had no prior history of chest pain or workup for chest pain. Patient reports feeling better after aspirin. Patient has remote history of crack cocaine use, has been clean for 8 months.  Past Medical History  Diagnosis Date  . No pertinent past medical history   . Head injury   . Post concussion syndrome     Past Surgical History  Procedure Date  . No past surgeries   . Knee surgery     History reviewed. No pertinent family history.  History  Substance Use Topics  . Smoking status: Current Everyday Smoker -- 0.2 packs/day    Types: Cigarettes  . Smokeless tobacco: Not on file  . Alcohol Use: No      Review of Systems  All other systems reviewed and are negative.    Allergies  Review of patient's allergies indicates no known allergies.  Home Medications  No current outpatient prescriptions on file.  BP 124/65  Pulse 70  Temp(Src) 98.1 F (36.7 C) (Oral)  Resp 16  Ht 6' (1.829 m)  Wt 235 lb (106.595 kg)  BMI 31.87 kg/m2  SpO2 97%  Physical Exam  Nursing note and vitals reviewed. Constitutional: He is oriented to person, place, and time. He appears  well-developed and well-nourished.  HENT:  Head: Normocephalic and atraumatic.  Nose: Nose normal.  Mouth/Throat: Oropharynx is clear and moist.  Eyes: Conjunctivae and EOM are normal. Pupils are equal, round, and reactive to light.  Neck: Normal range of motion. Neck supple. No JVD present. No tracheal deviation present. No thyromegaly present.  Cardiovascular: Normal rate, regular rhythm, normal heart sounds and intact distal pulses.  Exam reveals no gallop and no friction rub.   No murmur heard. Pulmonary/Chest: Effort normal and breath sounds normal. No stridor. No respiratory distress. He has no wheezes. He has no rales. He exhibits tenderness (pain is reproducible with palpation of left chest wall without crepitus deformity or overlying skin lesion).  Abdominal: Soft. Bowel sounds are normal. He exhibits no distension and no mass. There is no tenderness. There is no rebound and no guarding.  Musculoskeletal: Normal range of motion. He exhibits no edema and no tenderness.  Lymphadenopathy:    He has no cervical adenopathy.  Neurological: He is oriented to person, place, and time. A cranial nerve deficit is present. He exhibits normal muscle tone. Coordination normal.  Skin: Skin is dry. No rash noted. No erythema. No pallor.  Psychiatric: He has a normal mood and affect. His behavior is normal. Judgment and thought content normal.    ED Course  Procedures (including critical care time)  Labs Reviewed  BASIC METABOLIC PANEL - Abnormal; Notable for the following:  Potassium 3.4 (*)    Glucose, Bld 100 (*)    Calcium 8.3 (*)    GFR calc non Af Amer 80 (*)    All other components within normal limits  CBC  PRO B NATRIURETIC PEPTIDE  POCT I-STAT TROPONIN I  TROPONIN I   Chest Portable 1 View  10/17/2011  *RADIOLOGY REPORT*  Clinical Data: Acute onset chest pain.  PORTABLE CHEST - 1 VIEW 10/17/2011 0040 hours:  Comparison: One-view chest x-ray 01/01/2011.  Two-view chest x-ray  09/25/2006.  Findings: Extreme costophrenic angles excluded from the image. Cardiac silhouette normal and mediastinal contours unremarkable for the AP portable technique.  Lungs clear.  Pulmonary vascularity normal.  Bronchovascular markings normal.  No pneumothorax.  No pleural effusions.  IMPRESSION: No acute cardiopulmonary disease.  Original Report Authenticated By: Arnell Sieving, M.D.    Date: 10/17/2011  Rate: 80  Rhythm: normal sinus rhythm  QRS Axis: normal  Intervals: normal  ST/T Wave abnormalities: normal  Conduction Disutrbances:none  Narrative Interpretation:   Old EKG Reviewed: none available   1. Chest pain       MDM  61 Male with minimal risk factors for coronary disease or ACS with negative workup 2 negative cardiac markers and reproducible chest pain. Discussed with patient need for close followup with his primary care Dr. and/or cardiology or return for worsening condition        Olivia Mackie, MD 10/18/11 [phone]

## 2011-10-18 ENCOUNTER — Encounter (HOSPITAL_COMMUNITY): Payer: Self-pay | Admitting: Psychology

## 2011-10-18 ENCOUNTER — Other Ambulatory Visit (HOSPITAL_COMMUNITY): Payer: Medicare Other | Admitting: Psychology

## 2011-10-18 NOTE — Progress Notes (Signed)
Patient ID: Keith Mcdonald, male   DOB: 1960-06-07, 51 y.o.   MRN: [id number] Individual Therapy Session: met with the patient this morning. We reviewed his goals for treatment and he continues to make excellent progress in his sobriety and remains compliant in all respects. He continues to meet with the probation officer, Drug Court case worker and the [employer] case manager and is compliant with all legal requirements. The patient reported he is learning so much about recovery and is enjoying his connections at the [employer] and meeting all the people in the program. He continues to work on keeping his voice down at home and working on improving his relationship with wife and children. He has lots of ongoing support for his recovery and continues to attend daily AA meetings.

## 2011-10-19 NOTE — Progress Notes (Signed)
    Daily Group Progress Note  Program: CD-IOP   Group Time: 1-2:30 pm  Participation Level: Active  Behavioral Response: Appropriate  Type of Therapy: Psycho-education Group  Topic: Unconditionally Accepting Yourself and Others: A presentation was provided on the importance of acceptance. Group members were reminded that misunderstandings and unmet expectations frequently contribute to relapse. Their expectations of others, as well as themselves, must be honest and realistic. A handout was provided about 'Accepting Life on Life's Terms". Members offered their own experiences about acceptance and/or the lack of it and how it had frequently led to disagreements, arguments and regrets. There was good discussion among group members.   Group Time: 2:45- 4pm  Participation Level: Active  Behavioral Response: Sharing  Type of Therapy: Process Group  Topic:Group Process and Graduations: in the second half of group, members shared their current feelings and issues in early recovery. One new member introduced himself and the group spent a lot of time providing support to this fellow and repeatedly encouraging him to stay focused on today and remaining sober. At the conclusion of the session, two members graduated. One had returned after 30 + days in jail to officially complete the program while another successfully completed the program in the traditionally allotted time. There were kind words and lots of emotion during the ceremonies.    Summary: The patient reported he has had to work very hard on his expectations and admitted he expects more from himself than others. He hadn't recognized the inequity of that standard. The patient reported he is doing better at home with his family. He has agreed to work on speaking at a certain volume, but not getting too loud. He tends to get angrier as he gets louder. In process, the patient encouraged the new member to focus on staying sober and the other  issues in his life will be easier to address. The patient reported he continues to make good progress and remains complaint with the Drug Court program.   Family Program: Family present? No   Name of family member(s):   UDS collected: No Results: No  AA/NA attended?: YesMonday, Tuesday, Wednesday, Thursday, Friday, Saturday and Sunday  Sponsor?: Yes   Amogh Komatsu, LCAS

## 2011-10-20 ENCOUNTER — Other Ambulatory Visit (HOSPITAL_COMMUNITY): Payer: Medicare Other

## 2011-10-24 ENCOUNTER — Ambulatory Visit (HOSPITAL_COMMUNITY): Payer: Self-pay

## 2011-10-24 NOTE — Progress Notes (Unsigned)
    Daily Group Progress Note  Program: CD-IOP   Group Time: 1300 Oct 20, 2011  Participation Level: Active  Behavioral Response: Appropriate and Sharing  Type of Therapy: Process Group  Topic: Exploration of Sobriety and Supportive Network     Group Time: 1500 Oct 20, 2011  Participation Level: Active  Behavioral Response: Appropriate and Sharing  Type of Therapy: Psycho-education Group  Topic: Building and Maintaining Supportive Network   Summary: Patient 's sobriety date was 08/06/2011. Internal locus of control exhibited. Stated that he depends on his 12 step meetings and that he makes them a priority. Shared that he had a solid plan in place for his holiday weekend. Patient identified important characteristics of heathy supports.   Family Program: Family present? No    UDS collected: No   AA/NA attended?: YesWednesday, Thursday and Friday  Sponsor?: Yes  Joice Lofts RN MS EdS 10/24/2011  9:09 AM

## 2011-10-25 ENCOUNTER — Other Ambulatory Visit (HOSPITAL_COMMUNITY): Payer: Medicare Other | Admitting: Psychology

## 2011-10-26 LAB — PRESCRIPTION ABUSE MONITORING 17P, URINE
Benzodiazepine Screen, Urine: NEGATIVE ng/mL
Cannabinoid Scrn, Ur: NEGATIVE ng/mL
Cocaine Metabolites: NEGATIVE ng/mL
Creatinine, Urine: 217 mg/dL (ref >20.0)
MDMA URINE: NEGATIVE ng/mL
Meperidine, Ur: NEGATIVE ng/mL
Methadone Screen, Urine: NEGATIVE ng/mL
Tapentadol, urine: NEGATIVE ng/mL
Tramadol Scrn, Ur: NEGATIVE ng/mL
Zolpidem, Urine: NEGATIVE ng/mL

## 2011-10-26 NOTE — Progress Notes (Signed)
    Daily Group Progress Note  Program: CD-IOP   Group Time: 1-2:30 pm  Participation Level: Active  Behavioral Response: Appropriate  Type of Therapy: Psycho-education Group  Topic: AA Slogans: a presentation was provided identifying some of the more well-known sayings in AA. A detailed explanation of each of these sayings was provided. They included: First Things First, Easy Does It, and Live and Let Live. Members offered their own interpretations of these "clichs" and were able to offer examples of how they have used them in their own lives. There was good discussion and each member assured me that they understood the deeper meaning and symbolism of each saying.  Group Time: 2:45- 4pm  Participation Level: Active  Behavioral Response: Appropriate and Sharing  Type of Therapy: Process Group  Topic: Group Process: in the second half of group, members shared about current issues and concerns. One member admitted he had used over the weekend and the group questioned the sincerity of his claims about recovery. He readily admitted he would like to be sober, but he is still very actively living the addictive lifestyle on a daily basis. Members challenged and encouraged him to make the necessary changes, but it remains unclear whether he wants to chaos in his life to stop, but doesn't recognize the role that alcohol and cocaine have played in that chaos. Another member admitted having taken a Xanax over the weekend and the group also questioned how she possessed a pill and why she hadn't reached out to her fellow group members. There appear to be at least 2 members who are not really embracing the recovery mentality while the other members display a solid core of acceptance and the daily actions that this transformation requires.   Summary: The patient was very attentive in group and talked openly about his own recovery. He was very familiar with the sayings of AA and discussed his own  interpretations. In process, the patient challenged one of his newer group members and questioned his intentions in being in this program. Britt Bottom reminded this fellow that he hasn't even attended a meeting and pointed out that it was by going to [employer], listening to others with extensive sobriety and experience, and being in this program, that he began to grasp the basics of recovery. The patient made some excellent comments and continues to make excellent progress in his recovery.   Family Program: Family present? No   Name of family member(s):   UDS collected: Yes Results: negative  AA/NA attended?: Yes Monday, Tuesday, Wednesday, Thursday, Friday, Saturday and Sunday  Sponsor?: Yes   Keyari Kleeman, LCAS

## 2011-10-27 ENCOUNTER — Other Ambulatory Visit (HOSPITAL_COMMUNITY): Payer: Medicare Other | Admitting: Psychology

## 2011-10-30 ENCOUNTER — Other Ambulatory Visit (HOSPITAL_COMMUNITY): Payer: Medicare Other | Attending: Psychiatry | Admitting: Psychology

## 2011-10-30 DIAGNOSIS — F1420 Cocaine dependence, uncomplicated: Secondary | ICD-10-CM | POA: Insufficient documentation

## 2011-10-30 NOTE — Progress Notes (Signed)
    Daily Group Progress Note  Program: CD-IOP   Group Time: 1-2:30 pm  Participation Level: Active  Behavioral Response: Appropriate  Type of Therapy: Psycho-education Group  Topic: "If Nothing Changes, Nothing Changes": a presentation was provided on the need to make changes in all aspects of one's life as one enters into recovery. Members discussed the changes that they have made to support their efforts to remain abstinent. Others shared some of the hard lessons they had learned because they didn't make the necessary changes. at least three group members have very little sobriety and they were challenged to identify what and how they intend to make these changes? There was good feedback and disclosure and the resistance of some members directly challenged by their fellow group members.  Group Time: 2:45- 4pm  Participation Level: Active  Behavioral Response: Sharing  Type of Therapy: Process Group  Topic:Group Process: the second half of group was spent in process. Members shared their concerns and current issues they are struggling with. One asked about trust and how he can regain the trust of his wife? Other members provided feedback on their trust issues. As the session concluded, each member shared what they intended to change between now and the next group session.   Summary:The patient was adamant about making changes and talked about he has changed everything in his life. He noted that he had attended his children's graduation from elementary and middle school and how he wouldn't have been there if he was still smoking crack. He also pointed out that he has learned by going to meetings and listening. Haston reported that he had found the 7 pm curfew from drug court to seem limiting at first, but now he realizes he enjoys staying home in the evenings with his family. The patient provided excellent feedback and couldn't emphasize how important the 12-step community had been for  him. He made some excellent comments and when I noted at the end of the session that Barett would be graduating on Monday, the group let out a groan that this popular group member who soon be leaving.     Family Program: Family present? No   Name of family member(s):   UDS collected: No Results:   AA/NA attended?: YesMonday, Tuesday, Wednesday, Thursday, Friday, Saturday and Sunday  Sponsor?: Yes   Davinci Glotfelty, LCAS

## 2011-10-31 NOTE — Progress Notes (Signed)
    Daily Group Progress Note  Program: CD-IOP   Group Time: 1-2:30 pm  Participation Level: Active  Behavioral Response: Appropriate  Type of Therapy: Psycho-education Group  Topic:"Five Common Challenges in Early Recovery": a presentation was provided on the most common challenges that are experienced in early recovery. The presentation was accompanied by a handout from the [facility]. Members were invited to share about their own experiences with these 5 challenges, including friends, emotions, substances in the home, boredom and dealing with special occasions. There was good disclosure among the group and the importance of recognizing and addressing these 5 issues was clearly recognized among group members.   roup Time: 2:45-4pm   Participation Level: Active  Behavioral Response: Appropriate, Sharing and Assertive  Type of Therapy: Process Group  Topic: Process, Introduction, and Graduation: second half of group was spent in process. Members discussed their current struggles and issues in early recovery. A new group member was present and he introduced himself and described his struggles with alcohol and Ambien. As the session concluded, a graduation ceremony was held honoring a successfully graduating member. There were kind words shared and hopes for the future. This session proved very effective in demonstrating the caring and support gained from others in recovery. Summary: The patient was animated and shared openly about getting rid of old friends and using buddies. The patient reported he had experienced a challenge just this morning when an old drug dealer stopped him on the street. The patient admitted he had thought about what to say and blurted out, "I'm in recovery". This declaration seemed to establish a boundary and he was able to continue on his way. The patient challenged the member who admitted he had used this weekend and he stated that he was making excuses. In  process, Jalyn spoke about the changes he has made and his intentions to remain sober and reconnect with his family as never before. The graduation ceremony honored his successful completion of the program and the patient received kind words and accolades about the changes he has made and progress in his recovery. The patient graduated successfully from the program today.   Family Program: Family present? No   Name of family member(s):   UDS collected: No Results:   AA/NA attended?: YesMonday, Tuesday, Wednesday, Thursday, Friday, Saturday and Sunday  Sponsor?: Yes   Kyro Joswick, LCAS

## 2011-11-01 ENCOUNTER — Other Ambulatory Visit (HOSPITAL_COMMUNITY): Payer: Medicare Other

## 2011-11-03 ENCOUNTER — Other Ambulatory Visit (HOSPITAL_COMMUNITY): Payer: Medicare Other

## 2011-11-06 ENCOUNTER — Other Ambulatory Visit (HOSPITAL_COMMUNITY): Payer: Medicare Other

## 2012-06-05 ENCOUNTER — Encounter (HOSPITAL_COMMUNITY): Payer: Self-pay | Admitting: Emergency Medicine

## 2012-06-05 ENCOUNTER — Emergency Department (HOSPITAL_COMMUNITY)
Admission: EM | Admit: 2012-06-05 | Discharge: 2012-06-05 | Disposition: A | Discharge status: Home / Self Care | Payer: Medicare Other | Attending: Emergency Medicine | Admitting: Emergency Medicine

## 2012-06-05 DIAGNOSIS — Z9889 Other specified postprocedural states: Secondary | ICD-10-CM | POA: Insufficient documentation

## 2012-06-05 DIAGNOSIS — R112 Nausea with vomiting, unspecified: Secondary | ICD-10-CM | POA: Insufficient documentation

## 2012-06-05 DIAGNOSIS — R109 Unspecified abdominal pain: Secondary | ICD-10-CM | POA: Insufficient documentation

## 2012-06-05 DIAGNOSIS — M62838 Other muscle spasm: Secondary | ICD-10-CM | POA: Insufficient documentation

## 2012-06-05 DIAGNOSIS — R197 Diarrhea, unspecified: Secondary | ICD-10-CM | POA: Diagnosis not present

## 2012-06-05 DIAGNOSIS — F17200 Nicotine dependence, unspecified, uncomplicated: Secondary | ICD-10-CM | POA: Diagnosis not present

## 2012-06-05 DIAGNOSIS — Z87828 Personal history of other (healed) physical injury and trauma: Secondary | ICD-10-CM | POA: Diagnosis not present

## 2012-06-05 DIAGNOSIS — Z8659 Personal history of other mental and behavioral disorders: Secondary | ICD-10-CM | POA: Diagnosis not present

## 2012-06-05 DIAGNOSIS — E860 Dehydration: Secondary | ICD-10-CM | POA: Diagnosis not present

## 2012-06-05 LAB — CBC WITH DIFFERENTIAL/PLATELET
Basophils Absolute: 0 10*3/uL (ref 0.0–0.1)
Basophils Relative: 1 % (ref 0–1)
HCT: 48 % (ref 39.0–52.0)
MCHC: 36.7 g/dL — ABNORMAL HIGH (ref 30.0–36.0)
Monocytes Absolute: 0.4 10*3/uL (ref 0.1–1.0)
Neutro Abs: 2.4 10*3/uL (ref 1.7–7.7)
Neutrophils Relative %: 44 % (ref 43–77)
RDW: 14.2 % (ref 11.5–15.5)

## 2012-06-05 LAB — COMPREHENSIVE METABOLIC PANEL
ALT: 23 U/L (ref 0–53)
Alkaline Phosphatase: 67 U/L (ref 39–117)
CO2: 25 mEq/L (ref 19–32)
Calcium: 9.1 mg/dL (ref 8.4–10.5)
GFR calc Af Amer: 84 mL/min — ABNORMAL LOW (ref >90)
GFR calc non Af Amer: 73 mL/min — ABNORMAL LOW (ref >90)
Glucose, Bld: 80 mg/dL (ref 70–99)
Sodium: 138 mEq/L (ref 135–145)
Total Bilirubin: 0.5 mg/dL (ref 0.3–1.2)

## 2012-06-05 LAB — RAPID URINE DRUG SCREEN, HOSP PERFORMED: Benzodiazepines: NOT DETECTED

## 2012-06-05 MED ORDER — CYCLOBENZAPRINE HCL 10 MG PO TABS
10.0000 mg | ORAL_TABLET | Freq: Once | ORAL | Status: AC
  Administered 2012-06-05: 10 mg via ORAL
  Filled 2012-06-05: qty 1

## 2012-06-05 MED ORDER — KETOROLAC TROMETHAMINE 30 MG/ML IJ SOLN
30.0000 mg | Freq: Once | INTRAMUSCULAR | Status: AC
  Administered 2012-06-05: 30 mg via INTRAVENOUS
  Filled 2012-06-05: qty 1

## 2012-06-05 MED ORDER — SODIUM CHLORIDE 0.9 % IV BOLUS (SEPSIS)
1000.0000 mL | Freq: Once | INTRAVENOUS | Status: AC
  Administered 2012-06-05: 1000 mL via INTRAVENOUS

## 2012-06-05 MED ORDER — CYCLOBENZAPRINE HCL 10 MG PO TABS: 10.0000 mg | ORAL_TABLET | Freq: Three times a day (TID) | ORAL | Status: DC | PRN

## 2012-06-05 NOTE — ED Notes (Signed)
NP at bedside  Pt alert and oriented x4. Respirations even and unlabored, bilateral symmetrical rise and fall of chest. Skin warm and dry. In no acute distress. Denies needs.

## 2012-06-05 NOTE — ED Notes (Signed)
Pt c/o muscle cramps, PA made aware.

## 2012-06-05 NOTE — ED Notes (Signed)
Pt escorted to discharge window. Pt verbalized understanding discharge instructions. In no acute distress.  

## 2012-06-05 NOTE — ED Provider Notes (Signed)
History     CSN: [id number]  Arrival date & time 06/05/12  1001   First MD Initiated Contact with Patient 06/05/12 1047      Chief Complaint  Patient presents with  . Diarrhea  . Abdominal Cramping  . Nausea    (Consider location/radiation/quality/duration/timing/severity/associated sxs/prior treatment) Patient is a 52 y.o. male presenting with diarrhea and cramps. The history is provided by the patient.  Diarrhea The primary symptoms include abdominal pain, vomiting and diarrhea. Primary symptoms do not include fever or rash. The illness began yesterday.  The illness does not include chills. Associated symptoms comments: Diarrhea since yesterday, too numerous to count episodes. No bloody bowel movements. No fever. He has vomited twice. He reports generalized muscle cramping that is severe enough to draw and distort fingers and upper extremities. It lasts about 7-10 minutes. Per wife, these episodes happen when patient is relaxed - no hyperventilation or anxiety/agitation. .  Abdominal Cramping The primary symptoms of the illness include abdominal pain, vomiting and diarrhea. The primary symptoms of the illness do not include fever or shortness of breath.  Symptoms associated with the illness do not include chills.    Past Medical History  Diagnosis Date  . No pertinent past medical history   . Head injury   . Post concussion syndrome     Past Surgical History  Procedure Date  . No past surgeries   . Knee surgery     No family history on file.  History  Substance Use Topics  . Smoking status: Current Every Day Smoker -- 0.2 packs/day    Types: Cigarettes  . Smokeless tobacco: Not on file  . Alcohol Use: No      Review of Systems  Constitutional: Negative for fever and chills.  Respiratory: Negative.  Negative for cough and shortness of breath.   Cardiovascular: Negative.  Negative for chest pain.  Gastrointestinal: Positive for vomiting, abdominal pain and  diarrhea. Negative for blood in stool.  Genitourinary: Positive for decreased urine volume.  Musculoskeletal:       See HPI.  Skin: Negative.  Negative for rash.  Neurological: Negative.   Psychiatric/Behavioral: Negative for confusion and agitation.    Allergies  Review of patient's allergies indicates no known allergies.  Home Medications  No current outpatient prescriptions on file.  BP 132/84  Pulse 105  Temp 98.5 F (36.9 C)  Resp 16  Ht 6' (1.829 m)  Wt 225 lb (102.059 kg)  BMI 30.52 kg/m2  SpO2 98%  Physical Exam  Constitutional: He is oriented to person, place, and time. He appears well-developed and well-nourished. No distress.  HENT:  Head: Normocephalic.  Mouth/Throat: Mucous membranes are dry.  Neck: Normal range of motion. Neck supple.  Cardiovascular: Normal rate and regular rhythm.   Pulmonary/Chest: Effort normal and breath sounds normal.  Abdominal: Soft. Bowel sounds are normal. There is no tenderness. There is no rebound and no guarding.  Musculoskeletal: Normal range of motion. He exhibits no edema and no tenderness.       No muscle spasms or cramping on exam.   Neurological: He is alert and oriented to person, place, and time. He displays normal reflexes. Coordination normal.  Skin: Skin is warm and dry. No rash noted.  Psychiatric: He has a normal mood and affect.    ED Course  Procedures (including critical care time)   Labs Reviewed  CBC WITH DIFFERENTIAL  COMPREHENSIVE METABOLIC PANEL  URINE RAPID DRUG SCREEN (HOSP PERFORMED)   Results for  orders placed during the hospital encounter of 06/05/12  CBC WITH DIFFERENTIAL      Component Value Range   WBC 5.4  4.0 - 10.5 K/uL   RBC 5.50  4.22 - 5.81 MIL/uL   Hemoglobin 17.6 (*) 13.0 - 17.0 g/dL   HCT 98.1  19.1 - 47.8 %   MCV 87.3  78.0 - 100.0 fL   MCH 32.0  26.0 - 34.0 pg   MCHC 36.7 (*) 30.0 - 36.0 g/dL   RDW 29.5  62.1 - 30.8 %   Platelets 300  150 - 400 K/uL   Neutrophils Relative  44  43 - 77 %   Neutro Abs 2.4  1.7 - 7.7 K/uL   Lymphocytes Relative 46  12 - 46 %   Lymphs Abs 2.5  0.7 - 4.0 K/uL   Monocytes Relative 8  3 - 12 %   Monocytes Absolute 0.4  0.1 - 1.0 K/uL   Eosinophils Relative 1  0 - 5 %   Eosinophils Absolute 0.1  0.0 - 0.7 K/uL   Basophils Relative 1  0 - 1 %   Basophils Absolute 0.0  0.0 - 0.1 K/uL  URINE RAPID DRUG SCREEN (HOSP PERFORMED)      Component Value Range   Opiates NONE DETECTED  NONE DETECTED   Cocaine NONE DETECTED  NONE DETECTED   Benzodiazepines NONE DETECTED  NONE DETECTED   Amphetamines NONE DETECTED  NONE DETECTED   Tetrahydrocannabinol NONE DETECTED  NONE DETECTED   Barbiturates NONE DETECTED  NONE DETECTED  COMPREHENSIVE METABOLIC PANEL      Component Value Range   Sodium 138  135 - 145 mEq/L   Potassium 4.5  3.5 - 5.1 mEq/L   Chloride 103  96 - 112 mEq/L   CO2 25  19 - 32 mEq/L   Glucose, Bld 80  70 - 99 mg/dL   BUN 22  6 - 23 mg/dL   Creatinine, Ser 6.57  0.50 - 1.35 mg/dL   Calcium 9.1  8.4 - 84.6 mg/dL   Total Protein 7.7  6.0 - 8.3 g/dL   Albumin 4.0  3.5 - 5.2 g/dL   AST 23  0 - 37 U/L   ALT 23  0 - 53 U/L   Alkaline Phosphatase 67  39 - 117 U/L   Total Bilirubin 0.5  0.3 - 1.2 mg/dL   GFR calc non Af Amer 73 (*) >90 mL/min   GFR calc Af Amer 84 (*) >90 mL/min    No results found.   No diagnosis found.  1. Muscle spasm 2. Diarrhea   MDM  Iv fluids given. Lab studies unremarkable. Patient is a participant in a court mandated drug testing program and refuses all medications for pain or treatment. I discussed his condition with his case worker, Melba Coon, who stated that Flexeril was not a contraindicated medication and the patient could take it without consequence as far as the program. Medication given with improvement in symptoms.         Arnoldo Hooker, PA-C 06/05/12 1737

## 2012-06-05 NOTE — ED Notes (Signed)
States that he had several episodes of severe diarrhea and cramps last pm. Denies any outside foods for food poisoning.

## 2012-06-05 NOTE — ED Notes (Addendum)
PA at bedside.

## 2012-06-06 NOTE — ED Provider Notes (Signed)
Medical screening examination/treatment/procedure(s) were performed by non-physician practitioner and as supervising physician I was immediately available for consultation/collaboration.   Anaih Brander L Lathyn Griggs, MD 06/06/12 1510 

## 2012-12-06 ENCOUNTER — Emergency Department (HOSPITAL_COMMUNITY)
Admission: EM | Admit: 2012-12-06 | Discharge: 2012-12-07 | Disposition: A | Discharge status: Home / Self Care | Payer: Medicare Other | Attending: Emergency Medicine | Admitting: Emergency Medicine

## 2012-12-06 ENCOUNTER — Encounter (HOSPITAL_COMMUNITY): Payer: Self-pay

## 2012-12-06 DIAGNOSIS — Z23 Encounter for immunization: Secondary | ICD-10-CM | POA: Diagnosis not present

## 2012-12-06 DIAGNOSIS — Z79899 Other long term (current) drug therapy: Secondary | ICD-10-CM | POA: Diagnosis not present

## 2012-12-06 DIAGNOSIS — Y9389 Activity, other specified: Secondary | ICD-10-CM | POA: Insufficient documentation

## 2012-12-06 DIAGNOSIS — R21 Rash and other nonspecific skin eruption: Secondary | ICD-10-CM | POA: Insufficient documentation

## 2012-12-06 DIAGNOSIS — T6391XA Toxic effect of contact with unspecified venomous animal, accidental (unintentional), initial encounter: Secondary | ICD-10-CM | POA: Diagnosis not present

## 2012-12-06 DIAGNOSIS — S99919A Unspecified injury of unspecified ankle, initial encounter: Secondary | ICD-10-CM | POA: Insufficient documentation

## 2012-12-06 DIAGNOSIS — Y929 Unspecified place or not applicable: Secondary | ICD-10-CM | POA: Insufficient documentation

## 2012-12-06 DIAGNOSIS — S8990XA Unspecified injury of unspecified lower leg, initial encounter: Secondary | ICD-10-CM | POA: Insufficient documentation

## 2012-12-06 DIAGNOSIS — F17200 Nicotine dependence, unspecified, uncomplicated: Secondary | ICD-10-CM | POA: Insufficient documentation

## 2012-12-06 DIAGNOSIS — W268XXA Contact with other sharp object(s), not elsewhere classified, initial encounter: Secondary | ICD-10-CM | POA: Insufficient documentation

## 2012-12-06 DIAGNOSIS — T63461A Toxic effect of venom of wasps, accidental (unintentional), initial encounter: Secondary | ICD-10-CM | POA: Insufficient documentation

## 2012-12-06 MED ORDER — EPINEPHRINE 0.3 MG/0.3ML IJ SOAJ: 0.3000 mg | INTRAMUSCULAR | Status: DC | PRN

## 2012-12-06 MED ORDER — MORPHINE SULFATE 4 MG/ML IJ SOLN
4.0000 mg | Freq: Once | INTRAMUSCULAR | Status: AC
  Administered 2012-12-06: 4 mg via INTRAVENOUS
  Filled 2012-12-06: qty 1

## 2012-12-06 MED ORDER — ACETAMINOPHEN 500 MG PO TABS
1000.0000 mg | ORAL_TABLET | Freq: Once | ORAL | Status: AC
  Administered 2012-12-06: 1000 mg via ORAL
  Filled 2012-12-06: qty 2

## 2012-12-06 MED ORDER — TETANUS-DIPHTH-ACELL PERTUSSIS 5-2.5-18.5 LF-MCG/0.5 IM SUSP
0.5000 mL | Freq: Once | INTRAMUSCULAR | Status: AC
  Administered 2012-12-06: 0.5 mL via INTRAMUSCULAR
  Filled 2012-12-06: qty 0.5

## 2012-12-06 MED ORDER — SODIUM CHLORIDE 0.9 % IV BOLUS (SEPSIS)
1000.0000 mL | Freq: Once | INTRAVENOUS | Status: AC
  Administered 2012-12-06: 1000 mL via INTRAVENOUS

## 2012-12-06 MED ORDER — METHYLPREDNISOLONE SODIUM SUCC 125 MG IJ SOLR
125.0000 mg | Freq: Once | INTRAMUSCULAR | Status: AC
  Administered 2012-12-06: 125 mg via INTRAVENOUS
  Filled 2012-12-06: qty 2

## 2012-12-06 MED ORDER — PREDNISONE 50 MG PO TABS: 50.0000 mg | ORAL_TABLET | Freq: Every day | ORAL | Status: DC

## 2012-12-06 MED ORDER — DIPHENHYDRAMINE HCL 50 MG/ML IJ SOLN
50.0000 mg | Freq: Once | INTRAMUSCULAR | Status: AC
  Administered 2012-12-06: 50 mg via INTRAVENOUS
  Filled 2012-12-06: qty 1

## 2012-12-06 MED ORDER — FAMOTIDINE IN NACL 20-0.9 MG/50ML-% IV SOLN
20.0000 mg | Freq: Once | INTRAVENOUS | Status: AC
  Administered 2012-12-06: 20 mg via INTRAVENOUS
  Filled 2012-12-06: qty 50

## 2012-12-06 NOTE — ED Notes (Signed)
Pt states stepped on nail this morning with R foot, 16 years since last tetanus shot.

## 2012-12-06 NOTE — ED Notes (Signed)
Patient stung by yellow jacket approx 30 minutes ago. Throat is reddened and slightly swollen. Patient is  Alert and in no respiratory distress at this time

## 2012-12-06 NOTE — ED Provider Notes (Signed)
History    CSN: [id number] Arrival date & time 12/06/12  1836  First MD Initiated Contact with Patient 12/06/12 1847     Chief Complaint  Patient presents with  . Allergic Reaction   (Consider location/radiation/quality/duration/timing/severity/associated sxs/prior Treatment) The history is provided by the patient.  Keith Mcdonald is a 52 y.o. male history of anaphylaxis to bee stings, here with bee sting. He was out in the yard and accidentally stepped on a nail and then went across the yard. He was on his way to the ER when he noticed that of being stung him in his pants around 6 PM. He then noticed that his left face was swollen and a rash in his right buttock area. The bee was immediately removed by family and he came to the ER.   Past Medical History  Diagnosis Date  . No pertinent past medical history   . Head injury   . Post concussion syndrome    Past Surgical History  Procedure Laterality Date  . No past surgeries    . Knee surgery     History reviewed. No pertinent family history. History  Substance Use Topics  . Smoking status: Current Every Day Smoker -- 0.25 packs/day    Types: Cigarettes  . Smokeless tobacco: Not on file  . Alcohol Use: No    Review of Systems  HENT:       Throat swelling  Skin: Positive for wound.  All other systems reviewed and are negative.    Allergies  Bee venom  Home Medications   Current Outpatient Rx  Name  Route  Sig  Dispense  Refill  . Calcium-Vitamin D-Vitamin K (VIACTIV PO)   Oral   Take 1 tablet by mouth daily.         Marland Kitchen Phenyleph-CPM-DM-Aspirin (ALKA-SELTZER PLUS COLD & COUGH) 7.12-28-08-325 MG TBEF   Oral   Take 1 tablet by mouth as needed (cold symptoms.).          BP 150/86  Pulse 96  Temp(Src) 98 F (36.7 C) (Oral)  Resp 18  Ht 6' (1.829 m)  Wt 250 lb (113.399 kg)  BMI 33.9 kg/m2  SpO2 96% Physical Exam  Nursing note and vitals reviewed. Constitutional: He is oriented to person, place, and time.  He appears well-developed and well-nourished.  No stridor, talking in full sentences   HENT:  Head: Normocephalic.  Mouth/Throat: Oropharynx is clear and moist.  No oral or throat swelling   Eyes: Conjunctivae are normal. Pupils are equal, round, and reactive to light.  Neck: Normal range of motion. Neck supple.  Mild L cervical LAD   Cardiovascular: Normal rate, regular rhythm and normal heart sounds.   Pulmonary/Chest: Effort normal and breath sounds normal. No respiratory distress. He has no wheezes. He has no rales.  Abdominal: Soft. Bowel sounds are normal. He exhibits no distension. There is no tenderness. There is no rebound and no guarding.  R buttock with area of erythema surrounding the bite. No evidence of retained foreign body or stinger   Musculoskeletal: Normal range of motion.  Small puncture wound sole of R foot, no evidence of infection. 2+ pulses throughout   Neurological: He is alert and oriented to person, place, and time.  Skin: Skin is warm and dry.  Psychiatric: He has a normal mood and affect. His behavior is normal. Judgment and thought content normal.    ED Course  Procedures (including critical care time) Labs Reviewed - No data to display  No results found. No diagnosis found.  MDM  Keith Mcdonald is a 52 y.o. male here with R foot injury and allergic reaction. Will update tetanus, no need for xray given no evidence of foreign body in foot. Will give solumedrol, benadryl, pepcid. Will hold off on epi for now.   8:41 PM Patient observed for 2 hrs in the ED. Comfortable, eating food. Feels better. Will d/c home on course of steroids and prn benadryl. Will prescribe epi pen to go home with.    Richardean Canal, MD 12/06/12 2041

## 2013-03-12 ENCOUNTER — Emergency Department (HOSPITAL_COMMUNITY)
Admission: EM | Admit: 2013-03-12 | Discharge: 2013-03-12 | Disposition: A | Discharge status: Home / Self Care | Payer: Medicare Other | Attending: Emergency Medicine | Admitting: Emergency Medicine

## 2013-03-12 ENCOUNTER — Encounter (HOSPITAL_COMMUNITY): Payer: Self-pay | Admitting: Emergency Medicine

## 2013-03-12 ENCOUNTER — Emergency Department (HOSPITAL_COMMUNITY): Payer: Medicare Other

## 2013-03-12 DIAGNOSIS — IMO0002 Reserved for concepts with insufficient information to code with codable children: Secondary | ICD-10-CM | POA: Insufficient documentation

## 2013-03-12 DIAGNOSIS — S8990XA Unspecified injury of unspecified lower leg, initial encounter: Secondary | ICD-10-CM | POA: Insufficient documentation

## 2013-03-12 DIAGNOSIS — S4980XA Other specified injuries of shoulder and upper arm, unspecified arm, initial encounter: Secondary | ICD-10-CM | POA: Diagnosis not present

## 2013-03-12 DIAGNOSIS — S46909A Unspecified injury of unspecified muscle, fascia and tendon at shoulder and upper arm level, unspecified arm, initial encounter: Secondary | ICD-10-CM | POA: Insufficient documentation

## 2013-03-12 DIAGNOSIS — S4992XA Unspecified injury of left shoulder and upper arm, initial encounter: Secondary | ICD-10-CM

## 2013-03-12 DIAGNOSIS — Z87820 Personal history of traumatic brain injury: Secondary | ICD-10-CM | POA: Diagnosis not present

## 2013-03-12 DIAGNOSIS — Z8659 Personal history of other mental and behavioral disorders: Secondary | ICD-10-CM | POA: Diagnosis not present

## 2013-03-12 DIAGNOSIS — Y9389 Activity, other specified: Secondary | ICD-10-CM | POA: Insufficient documentation

## 2013-03-12 DIAGNOSIS — W230XXA Caught, crushed, jammed, or pinched between moving objects, initial encounter: Secondary | ICD-10-CM | POA: Insufficient documentation

## 2013-03-12 DIAGNOSIS — Z79899 Other long term (current) drug therapy: Secondary | ICD-10-CM | POA: Insufficient documentation

## 2013-03-12 DIAGNOSIS — M25539 Pain in unspecified wrist: Secondary | ICD-10-CM | POA: Diagnosis not present

## 2013-03-12 DIAGNOSIS — S59909A Unspecified injury of unspecified elbow, initial encounter: Secondary | ICD-10-CM | POA: Diagnosis not present

## 2013-03-12 DIAGNOSIS — F17200 Nicotine dependence, unspecified, uncomplicated: Secondary | ICD-10-CM | POA: Diagnosis not present

## 2013-03-12 DIAGNOSIS — Y929 Unspecified place or not applicable: Secondary | ICD-10-CM | POA: Insufficient documentation

## 2013-03-12 DIAGNOSIS — M79609 Pain in unspecified limb: Secondary | ICD-10-CM | POA: Diagnosis not present

## 2013-03-12 DIAGNOSIS — S99922A Unspecified injury of left foot, initial encounter: Secondary | ICD-10-CM

## 2013-03-12 MED ORDER — TRAMADOL HCL 50 MG PO TABS: 50.0000 mg | ORAL_TABLET | Freq: Four times a day (QID) | ORAL | Status: DC | PRN

## 2013-03-12 NOTE — ED Notes (Signed)
Patient was removing safe and arm got caught between the safe and the wall

## 2013-03-12 NOTE — ED Notes (Signed)
Pt c/o pain to L forearm. Slight swelling to forearm. ROM intact. Pt states his L foot is also sore. States he has had pain in that same spot for a while, but it got worse today after he moved the safe. Pt ambulatory to exam room with steady gait.

## 2013-03-12 NOTE — ED Provider Notes (Signed)
CSN: [id number]     Arrival date & time 03/12/13  1914 History   This chart was scribed for non-physician practitioner Sharilyn Sites, PA-C working with Shon Baton, MD by Joaquin Music, ED Scribe. This patient was seen in room WTR5/WTR5 and the patient's care was started at 8:25 PM .    Chief Complaint  Patient presents with  . Arm Pain   The history is provided by the patient. No language interpreter was used.   HPI Comments: Keith Mcdonald is a 52 y.o. male who presents to the Emergency Department complaining of worsening left forearm pain onset 3 hours. Pt states he was removing a safe and his arm got caught in between the safe and the wall. Safe made of metal and weighed approx 1500 lbs.  Pt states pain radiates down left forearm.  No pain in wrist or hand.  Denies any numbness or paresthesias.  No prior left arm or hand injury.  Pt is right hand dominant.   Pt also complain of left foot pain. Pt states his foot has been hurting him a lot recently but worse tonight after moving safe.  Unsure if he also hit his foot on safe.   Denies any numbness or paresthesias.  No prior foot injury.  No meds taken PTA.  No hx of gout.  Past Medical History  Diagnosis Date  . No pertinent past medical history   . Head injury   . Post concussion syndrome    Past Surgical History  Procedure Laterality Date  . No past surgeries    . Knee surgery     History reviewed. No pertinent family history. History  Substance Use Topics  . Smoking status: Current Every Day Smoker -- 0.25 packs/day    Types: Cigarettes  . Smokeless tobacco: Not on file  . Alcohol Use: No    Review of Systems  Musculoskeletal: Positive for arthralgias.  All other systems reviewed and are negative.    Allergies  Bee venom  Home Medications   Current Outpatient Rx  Name  Route  Sig  Dispense  Refill  . Calcium-Vitamin D-Vitamin K (VIACTIV PO)   Oral   Take 1 tablet by mouth daily.         Marland Kitchen  EPINEPHrine (EPIPEN) 0.3 mg/0.3 mL SOAJ   Intramuscular   Inject 0.3 mLs (0.3 mg total) into the muscle as needed.   1 Device   0   . Phenyleph-CPM-DM-Aspirin (ALKA-SELTZER PLUS COLD & COUGH) 7.12-28-08-325 MG TBEF   Oral   Take 1 tablet by mouth as needed (cold symptoms.).         Marland Kitchen predniSONE (DELTASONE) 50 MG tablet   Oral   Take 1 tablet (50 mg total) by mouth daily.   5 tablet   0    Triage Vitals:BP 141/88  Pulse 98  Temp(Src) 98.8 F (37.1 C) (Oral)  Resp 20  Ht 6\' 2"  (1.88 m)  Wt 250 lb (113.399 kg)  BMI 32.08 kg/m2  SpO2 96%  Physical Exam  Nursing note and vitals reviewed. Constitutional: He is oriented to person, place, and time. He appears well-developed and well-nourished. No distress.  HENT:  Head: Normocephalic and atraumatic.  Eyes: Conjunctivae and EOM are normal. Pupils are equal, round, and reactive to light.  Neck: Normal range of motion. Neck supple.  Cardiovascular: Normal rate, regular rhythm and normal heart sounds.   Pulmonary/Chest: Effort normal and breath sounds normal. No respiratory distress. He has no wheezes.  Musculoskeletal: Normal range of motion.       Left forearm: He exhibits tenderness and bony tenderness. He exhibits no swelling, no edema, no deformity and no laceration.       Arms:      Left foot: He exhibits tenderness. He exhibits normal range of motion, no bony tenderness, no swelling, normal capillary refill, no crepitus, no deformity and no laceration.  TTP along ulnar aspect of left forearm; small abrasion noted without active bleeding or bruising; no gross deformity; full ROM of left shoulder, elbow, and wrist; normal grip strength; moves all fingers appropriately; strong radial pulse and cap refill, sensation intact TTP along lateral left foot; full ROM maintained; strong distal pulse and cap refill; sensation intact  Neurological: He is alert and oriented to person, place, and time.  Skin: Skin is warm and dry. He is not  diaphoretic.  Psychiatric: He has a normal mood and affect.    ED Course  Procedures  DIAGNOSTIC STUDIES: Oxygen Saturation is 96% on RA, normal by my interpretation.    COORDINATION OF CARE: 8:28 PM-Discussed treatment plan which includes X-rays. Pt agreed to plan.   Labs Review Labs Reviewed - No data to display Imaging Review Dg Forearm Left  03/12/2013   CLINICAL DATA:  Pain post trauma  EXAM: LEFT FOREARM - 2 VIEW  COMPARISON:  None.  FINDINGS: Frontal and lateral views were obtained. There is no fracture or dislocation. Joint spaces appear intact. No abnormal periosteal reaction. Incidental note is made of a minus ulnar variant.  IMPRESSION: No fracture or dislocation.   Electronically Signed   By: Bretta Bang M.D.   On: 03/12/2013 20:52   Dg Foot Complete Left  03/12/2013   CLINICAL DATA:  Pain post trauma  EXAM: LEFT FOOT - COMPLETE 3+ VIEW  COMPARISON:  None.  FINDINGS: Frontal, oblique, and lateral views were obtained. There is no fracture or dislocation. Joint spaces appear intact. No erosive change.  IMPRESSION: No abnormality noted.   Electronically Signed   By: Bretta Bang M.D.   On: 03/12/2013 20:53   MDM   1. Arm injury, left, initial encounter   2. Foot injury, left, initial encounter    X-rays negative for acute fx or dislocations.  Foot pain not consistent with gout.  Rx tramadol.  FU with PCP if problems occur.  Discussed plan with pt, he agreed.  Return precautions advised.  I personally performed the services described in this documentation, which was scribed in my presence. The recorded information has been reviewed and is accurate.  Garlon Hatchet, PA-C 03/12/13 2108

## 2013-03-13 NOTE — ED Provider Notes (Signed)
Medical screening examination/treatment/procedure(s) were performed by non-physician practitioner and as supervising physician I was immediately available for consultation/collaboration.  Shon Baton, MD 03/13/13 [id number]

## 2013-05-24 ENCOUNTER — Encounter (HOSPITAL_COMMUNITY): Payer: Self-pay | Admitting: Emergency Medicine

## 2013-05-24 ENCOUNTER — Emergency Department (HOSPITAL_COMMUNITY): Payer: Medicare Other

## 2013-05-24 ENCOUNTER — Emergency Department (HOSPITAL_COMMUNITY)
Admission: EM | Admit: 2013-05-24 | Discharge: 2013-05-24 | Disposition: A | Discharge status: Home / Self Care | Payer: Medicare Other | Attending: Emergency Medicine | Admitting: Emergency Medicine

## 2013-05-24 DIAGNOSIS — Z8669 Personal history of other diseases of the nervous system and sense organs: Secondary | ICD-10-CM | POA: Insufficient documentation

## 2013-05-24 DIAGNOSIS — J40 Bronchitis, not specified as acute or chronic: Secondary | ICD-10-CM

## 2013-05-24 DIAGNOSIS — F17200 Nicotine dependence, unspecified, uncomplicated: Secondary | ICD-10-CM | POA: Diagnosis not present

## 2013-05-24 DIAGNOSIS — Z87828 Personal history of other (healed) physical injury and trauma: Secondary | ICD-10-CM | POA: Insufficient documentation

## 2013-05-24 DIAGNOSIS — J209 Acute bronchitis, unspecified: Secondary | ICD-10-CM | POA: Diagnosis not present

## 2013-05-24 DIAGNOSIS — R079 Chest pain, unspecified: Secondary | ICD-10-CM | POA: Diagnosis not present

## 2013-05-24 DIAGNOSIS — R05 Cough: Secondary | ICD-10-CM | POA: Diagnosis not present

## 2013-05-24 MED ORDER — ALBUTEROL SULFATE HFA 108 (90 BASE) MCG/ACT IN AERS
2.0000 | INHALATION_SPRAY | RESPIRATORY_TRACT | Status: DC | PRN
  Filled 2013-05-24: qty 6.7

## 2013-05-24 MED ORDER — AMOXICILLIN-POT CLAVULANATE 875-125 MG PO TABS: 1.0000 | ORAL_TABLET | Freq: Two times a day (BID) | ORAL | Status: DC

## 2013-05-24 MED ORDER — BENZONATATE 100 MG PO CAPS: 100.0000 mg | ORAL_CAPSULE | Freq: Three times a day (TID) | ORAL | Status: DC

## 2013-05-24 NOTE — ED Provider Notes (Signed)
Medical screening examination/treatment/procedure(s) were performed by non-physician practitioner and as supervising physician I was immediately available for consultation/collaboration.  EKG Interpretation   None         Keith Mcdonald M Dolorez Jeffrey, MD 05/24/13 1602 

## 2013-05-24 NOTE — ED Notes (Signed)
Pt from home reports cough with blood streaked sputum, nasal congestion x1 week. Pt reports that his whole family has URI s/sx. Pt denies HA, sore throat, fever, N/V/D. Pt is A&O and in NAD

## 2013-05-24 NOTE — ED Provider Notes (Signed)
CSN: [id number]     Arrival date & time 05/24/13  0944 History   First MD Initiated Contact with Patient 05/24/13 1001     Chief Complaint  Patient presents with  . Cough  . Nasal Congestion   (Consider location/radiation/quality/duration/timing/severity/associated sxs/prior Treatment) HPI Comments: Patient with history of smoking -- presents with complaint of cough for several weeks. Over the past one week the cough has become worse. Cough is productive. He denies fever, significant ear pain, sore throat, runny nose, neck soreness. He denies history of COPD, CHF, asthma. He denies lower extremity swelling. He denies resected for PE. No chest pain or shortness of breath. He has tried over-the-counter cough medication without relief. 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 Onset of symptoms gradual. Course is constant. Nothing makes symptoms better or worse.  Patient is a 52 y.o. male presenting with cough. The history is provided by the patient.  Cough Associated symptoms: no chills, no ear pain, no fever, no headaches, no myalgias, no rash, no rhinorrhea, no sore throat and no wheezing     Past Medical History  Diagnosis Date  . No pertinent past medical history   . Head injury   . Post concussion syndrome    Past Surgical History  Procedure Laterality Date  . No past surgeries    . Knee surgery     No family history on file. History  Substance Use Topics  . Smoking status: Current Every Day Smoker -- 0.25 packs/day    Types: Cigarettes  . Smokeless tobacco: Not on file  . Alcohol Use: No    Review of Systems  Constitutional: Negative for fever, chills and fatigue.  HENT: Negative for congestion, ear pain, rhinorrhea, sinus pressure and sore throat.   Eyes: Negative for redness.  Respiratory: Positive for cough. Negative for wheezing.   Gastrointestinal: Negative for nausea, vomiting, abdominal pain and diarrhea.  Genitourinary:  Negative for dysuria.  Musculoskeletal: Negative for myalgias and neck stiffness.  Skin: Negative for rash.  Neurological: Negative for headaches.  Hematological: Negative for adenopathy.    Allergies  Bee venom  Home Medications   Current Outpatient Rx  Name  Route  Sig  Dispense  Refill  . DM-Phenylephrine-Acetaminophen (ALKA-SELTZER PLS SINUS & COUGH PO)   Oral   Take 2 tablets by mouth daily as needed (cold).         . sodium-potassium bicarbonate (ALKA-SELTZER GOLD) TBEF dissolvable tablet   Oral   Take 1 tablet by mouth daily as needed (cold).          BP 148/93  Pulse 83  Temp(Src) 98.9 F (37.2 C) (Oral)  Resp 20  SpO2 99% Physical Exam  Nursing note and vitals reviewed. Constitutional: He appears well-developed and well-nourished.  HENT:  Head: Normocephalic and atraumatic.  Right Ear: Tympanic membrane, external ear and ear canal normal.  Left Ear: Tympanic membrane, external ear and ear canal normal.  Nose: Nose normal. No mucosal edema or rhinorrhea.  Mouth/Throat: Uvula is midline, oropharynx is clear and moist and mucous membranes are normal. Mucous membranes are not dry. No trismus in the jaw. No uvula swelling. No oropharyngeal exudate, posterior oropharyngeal edema, posterior oropharyngeal erythema or tonsillar abscesses.  Eyes: Conjunctivae are normal. Right eye exhibits no discharge. Left eye exhibits no discharge.  Neck: Normal range of motion. Neck supple.  Cardiovascular: Normal rate, regular rhythm and normal heart sounds.   Pulmonary/Chest: Effort normal and breath sounds normal. No  respiratory distress. He has no wheezes. He has no rales.  Abdominal: Soft. There is no tenderness.  Neurological: He is alert.  Skin: Skin is warm and dry.  Psychiatric: He has a normal mood and affect.    ED Course  Procedures (including critical care time) Labs Review Labs Reviewed - No data to display Imaging Review Dg Chest 2 View  05/24/2013    CLINICAL DATA:  Cough and chest pain  EXAM: CHEST  2 VIEW  COMPARISON:  Oct 17, 2011  FINDINGS: Lungs are clear. Heart size and pulmonary vascularity are normal. No adenopathy. No pneumothorax. No bone lesions.  IMPRESSION: No abnormality noted.   Electronically Signed   By: Bretta Bang M.D.   On: 05/24/2013 10:59    EKG Interpretation   None      10:28 AM Patient seen and examined. Work-up initiated. Medications ordered.   Vital signs reviewed and are as follows: Filed Vitals:   05/24/13 1007  BP: 148/93  Pulse: 83  Temp: 98.9 F (37.2 C)  Resp: 20   11:32 AM chest x-ray results reviewed. Patient informed. Offered albuterol inhaler, patient states that he will not take this. Will give Tessalon for cough. Patient encouraged to treat cough for several days and to fill antibiotics only if no improvement in 3 days or if worsening. Patient verbalizes understanding and agrees with plan. Counseled followup with primary care physician if not improving.   MDM   1. Bronchitis    Patient with likely bronchitis. Exposure to dust also possible trigger. No concern for PNA given normal lung exam/x-ray. Antibiotics given due to duration of cough -- patient asked to fill these only if no improvement in 72 hrs or worsening. No fever or other systemic symptoms. No h/o PE or other risk factors -- very low risk.  Conservative therapy indicated.       Renne Crigler, PA-C 05/24/13 1135

## 2013-09-22 ENCOUNTER — Ambulatory Visit: Payer: Medicare Other

## 2013-09-22 ENCOUNTER — Ambulatory Visit (INDEPENDENT_AMBULATORY_CARE_PROVIDER_SITE_OTHER): Payer: Medicare Other | Admitting: Family Medicine

## 2013-09-22 VITALS — BP 130/82 | HR 66 | Temp 97.9°F | Resp 16 | Ht 71.0 in | Wt 251.0 lb

## 2013-09-22 DIAGNOSIS — M79642 Pain in left hand: Secondary | ICD-10-CM

## 2013-09-22 DIAGNOSIS — M79609 Pain in unspecified limb: Secondary | ICD-10-CM | POA: Diagnosis not present

## 2013-09-22 DIAGNOSIS — T148XXA Other injury of unspecified body region, initial encounter: Secondary | ICD-10-CM | POA: Diagnosis not present

## 2013-09-22 LAB — POCT CBC
Granulocyte percent: 33.6 %G — AB (ref 37–80)
HCT, POC: 45.3 % (ref 43.5–53.7)
Hemoglobin: 14.8 g/dL (ref 14.1–18.1)
Lymph, poc: 3.6 — AB (ref 0.6–3.4)
MCH: 30.5 pg (ref 27–31.2)
MCHC: 32.7 g/dL (ref 31.8–35.4)
MCV: 93.2 fL (ref 80–97)
MID (CBC): 0.5 (ref 0–0.9)
MPV: 9 fL (ref 0–99.8)
PLATELET COUNT, POC: 355 10*3/uL (ref 142–424)
POC Granulocyte: 2.1 (ref 2–6.9)
POC LYMPH %: 58.8 % — AB (ref 10–50)
POC MID %: 7.6 % (ref 0–12)
RBC: 4.86 M/uL (ref 4.69–6.13)
RDW, POC: 14.5 %
WBC: 6.2 10*3/uL (ref 4.6–10.2)

## 2013-09-22 NOTE — Patient Instructions (Signed)
Please proceed directly to: [facility]  Directions  Health Consultant  Address: [address], [place], [place] [address]  [phone]  Do not eat or drink anything!  Take care and let us know if we can do anything to help

## 2013-09-22 NOTE — Progress Notes (Signed)
[facility] [address], [place] [address] [phone]  Date:  09/22/2013   Name:  Keith Mcdonald   DOB:  04/12/1961   MRN:  [id number]  PCP:  Cala BradfordWHITE,CYNTHIA S, MD    Chief Complaint: cut left index finger with saw and now possibly infected   History of Present Illness:  Keith Ripperlvin R Hua is a 53 y.o. very pleasant male patient who presents with the following:  Here as a new patient today. He was using a grinding saw to cut some bricks- it cut him across his left index finger over the PIP.  He cleaned and wrapped it, and thought it was doing ok.  This occurred on Friday and today is Monday. However over the last couple of days he became concerned about his finger.  He notes that the area under the cut seems "soft and swollen."'  He does not think he had a fever.  He is concerned that the softness in his finger means pus.   He is having a lot of pain in his finger.    He had a Tdap 11/2012  He is otherwise generally healthy.   There are no active problems to display for this patient.   Past Medical History  Diagnosis Date  . No pertinent past medical history   . Head injury   . Post concussion syndrome     Past Surgical History  Procedure Laterality Date  . No past surgeries    . Knee surgery      History  Substance Use Topics  . Smoking status: Current Every Day Smoker -- 0.25 packs/day    Types: Cigarettes  . Smokeless tobacco: Not on file  . Alcohol Use: No    Family History  Problem Relation Age of Onset  . Hypertension Mother   . Hypertension Father   . Hypertension Sister     Allergies  Allergen Reactions  . Bee Venom Anaphylaxis    Medication list has been reviewed and updated.  No current outpatient prescriptions on file prior to visit.   No current facility-administered medications on file prior to visit.    Review of Systems:  As per HPI- otherwise negative.   Physical Examination: Filed Vitals:   09/22/13 1350  BP:  130/82  Pulse: 66  Temp: 97.9 F (36.6 C)  Resp: 16   Filed Vitals:   09/22/13 1350  Height: 5\' 11"  (1.803 m)  Weight: 251 lb (113.853 kg)   Body mass index is 35.02 kg/(m^2). Ideal Body Weight: Weight in (lb) to have BMI = 25: 178.9  GEN: WDWN, NAD, Non-toxic, A & O x 3, looks well HEENT: Atraumatic, Normocephalic. Neck supple. No masses, No LAD. Ears and Nose: No external deformity. CV: RRR, No M/G/R. No JVD. No thrill. No extra heart sounds. PULM: CTA B, no wheezes, crackles, rhonchi. No retractions. No resp. distress. No accessory muscle use. EXTR: No c/c/e NEURO Normal gait.  PSYCH: Normally interactive. Conversant. Not depressed or anxious appearing.  Calm demeanor.  Left index finger; there is a healing wound over the PIP joint.  This area is tender and swollen, feels mildly fluctuant. He has normal strength with flexion and extension of the finger. Perfusion is normal.    [facility] reading (PRIMARY) by  Dr. Patsy Lageropland. Left hand: no evidence of fracture left index finger  LEFT HAND - COMPLETE 3+ VIEW  COMPARISON: None.  FINDINGS: Mild soft tissue swelling about the left index finger PIP joint. No  acute bony abnormality. No fracture, subluxation or dislocation. No radiopaque foreign bodies.  IMPRESSION: No acute bony abnormality.   Results for orders placed in visit on 09/22/13  POCT CBC      Result Value Ref Range   WBC 6.2  4.6 - 10.2 K/uL   Lymph, poc 3.6 (*) 0.6 - 3.4   POC LYMPH PERCENT 58.8 (*) 10 - 50 %L   MID (cbc) 0.5  0 - 0.9   POC MID % 7.6  0 - 12 %M   POC Granulocyte 2.1  2 - 6.9   Granulocyte percent 33.6 (*) 37 - 80 %G   RBC 4.86  4.69 - 6.13 M/uL   Hemoglobin 14.8  14.1 - 18.1 g/dL   HCT, POC 13.045.3  86.543.5 - 53.7 %   MCV 93.2  80 - 97 fL   MCH, POC 30.5  27 - 31.2 pg   MCHC 32.7  31.8 - 35.4 g/dL   RDW, POC 78.414.5     Platelet Count, POC 355  142 - 424 K/uL   MPV 9.0  0 - 99.8 fL    Assessment and Plan: Left hand pain - Plan: POCT CBC, DG Hand  Complete Left  The [facility] [place] kindly agreed to see him now- he will proceed straight to their office.  He has a CD of his x-rays.    Signed Abbe AmsterdamJessica Copland, MD

## 2014-01-04 ENCOUNTER — Encounter (HOSPITAL_COMMUNITY): Payer: Self-pay | Admitting: Emergency Medicine

## 2014-01-04 ENCOUNTER — Emergency Department (HOSPITAL_COMMUNITY)
Admission: EM | Admit: 2014-01-04 | Discharge: 2014-01-04 | Disposition: A | Discharge status: Home / Self Care | Payer: Medicare Other | Attending: Emergency Medicine | Admitting: Emergency Medicine

## 2014-01-04 DIAGNOSIS — R319 Hematuria, unspecified: Secondary | ICD-10-CM | POA: Diagnosis not present

## 2014-01-04 DIAGNOSIS — F17200 Nicotine dependence, unspecified, uncomplicated: Secondary | ICD-10-CM | POA: Diagnosis not present

## 2014-01-04 LAB — I-STAT CHEM 8, ED
BUN: 16 mg/dL (ref 6–23)
CHLORIDE: 107 meq/L (ref 96–112)
CREATININE: 0.9 mg/dL (ref 0.50–1.35)
Calcium, Ion: 1.15 mmol/L (ref 1.12–1.23)
Glucose, Bld: 84 mg/dL (ref 70–99)
HCT: 46 % (ref 39.0–52.0)
HEMOGLOBIN: 15.6 g/dL (ref 13.0–17.0)
POTASSIUM: 4.1 meq/L (ref 3.7–5.3)
SODIUM: 139 meq/L (ref 137–147)
TCO2: 22 mmol/L (ref 0–100)

## 2014-01-04 LAB — URINALYSIS, ROUTINE W REFLEX MICROSCOPIC
BILIRUBIN URINE: NEGATIVE
Glucose, UA: NEGATIVE mg/dL
KETONES UR: NEGATIVE mg/dL
Leukocytes, UA: NEGATIVE
Nitrite: NEGATIVE
PROTEIN: NEGATIVE mg/dL
Specific Gravity, Urine: 1.022 (ref 1.005–1.030)
UROBILINOGEN UA: 0.2 mg/dL (ref 0.0–1.0)
pH: 5.5 (ref 5.0–8.0)

## 2014-01-04 LAB — URINE MICROSCOPIC-ADD ON

## 2014-01-04 NOTE — ED Notes (Signed)
He states that he had a b.m. This morning which involved some straining to achieve.  He states subsequent to this b.m. He urinated and "it was real bloody".  He cites some heavy lifting two days ago; and he denies fever, nor any other sign of current illness.  He is in no distress.

## 2014-01-04 NOTE — Discharge Instructions (Signed)
If you were given medicines take as directed.  If you are on coumadin or contraceptives realize their levels and effectiveness is altered by many different medicines.  If you have any reaction (rash, tongues swelling, other) to the medicines stop taking and see a physician.   Please follow up as directed and return to the ER or see a physician for new or worsening symptoms.  Thank you. Filed Vitals:   01/04/14 0946  BP: 138/88  Pulse: 63  Temp: 98.1 F (36.7 C)  TempSrc: Oral  Resp: 20  SpO2: 100%    Hematuria Hematuria is blood in your urine. It can be caused by a bladder infection, kidney infection, prostate infection, kidney stone, or cancer of your urinary tract. Infections can usually be treated with medicine, and a kidney stone usually will pass through your urine. If neither of these is the cause of your hematuria, further workup to find out the reason may be needed. It is very important that you tell your health care provider about any blood you see in your urine, even if the blood stops without treatment or happens without causing pain. Blood in your urine that happens and then stops and then happens again can be a symptom of a very serious condition. Also, pain is not a symptom in the initial stages of many urinary cancers. HOME CARE INSTRUCTIONS   Drink lots of fluid, 3-4 quarts a day. If you have been diagnosed with an infection, cranberry juice is especially recommended, in addition to large amounts of water.  Avoid caffeine, tea, and carbonated beverages because they tend to irritate the bladder.  Avoid alcohol because it may irritate the prostate.  Take all medicines as directed by your health care provider.  If you were prescribed an antibiotic medicine, finish it all even if you start to feel better.  If you have been diagnosed with a kidney stone, follow your health care provider's instructions regarding straining your urine to catch the stone.  Empty your bladder  often. Avoid holding urine for long periods of time.  After a bowel movement, women should cleanse front to back. Use each tissue only once.  Empty your bladder before and after sexual intercourse if you are a male. SEEK MEDICAL CARE IF:  You develop back pain.  You have a fever.  You have a feeling of sickness in your stomach (nausea) or vomiting.  Your symptoms are not better in 3 days. Return sooner if you are getting worse. SEEK IMMEDIATE MEDICAL CARE IF:   You develop severe vomiting and are unable to keep the medicine down.  You develop severe back or abdominal pain despite taking your medicines.  You begin passing a large amount of blood or clots in your urine.  You feel extremely weak or faint, or you pass out. MAKE SURE YOU:   Understand these instructions.  Will watch your condition.  Will get help right away if you are not doing well or get worse. Document Released: 05/15/2005 Document Revised: 09/29/2013 Document Reviewed: 01/13/2013 [facility] Patient Information 2015 [employer] [place]. This information is not intended to replace advice given to you by your health care provider. Make sure you discuss any questions you have with your health care provider.

## 2014-01-04 NOTE — ED Provider Notes (Signed)
CSN: [id number]     Arrival date & time 01/04/14  [id number] History   First MD Initiated Contact with Patient 01/04/14 [phone]     Chief Complaint  Patient presents with  . Hematuria     (Consider location/radiation/quality/duration/timing/severity/associated sxs/prior Treatment) HPI Comments: Patient with concussion history no surgery history presents with hematuria since this morning. Patient had small amount of blood at the end of his stream this morning. Patient had mild brief episode years ago that resolved. No known kidney or bladder problems. No known kidney stones. No pain vomiting fevers or other symptoms. No blood thinners.  Patient is a 53 y.o. male presenting with hematuria. The history is provided by the patient.  Hematuria This is a new problem. Pertinent negatives include no chest pain, no abdominal pain, no headaches and no shortness of breath.    Past Medical History  Diagnosis Date  . No pertinent past medical history   . Head injury   . Post concussion syndrome    Past Surgical History  Procedure Laterality Date  . No past surgeries    . Knee surgery     Family History  Problem Relation Age of Onset  . Hypertension Mother   . Hypertension Father   . Hypertension Sister    History  Substance Use Topics  . Smoking status: Current Every Day Smoker -- 0.25 packs/day    Types: Cigarettes  . Smokeless tobacco: Not on file  . Alcohol Use: No    Review of Systems  Constitutional: Negative for fever and chills.  HENT: Negative for congestion.   Eyes: Negative for visual disturbance.  Respiratory: Negative for shortness of breath.   Cardiovascular: Negative for chest pain.  Gastrointestinal: Negative for vomiting and abdominal pain.  Genitourinary: Positive for hematuria. Negative for dysuria and flank pain.  Musculoskeletal: Negative for back pain, neck pain and neck stiffness.  Skin: Negative for rash.  Neurological: Negative for light-headedness and headaches.       Allergies  Bee venom  Home Medications   Prior to Admission medications   Not on File   BP 138/88  Pulse 63  Temp(Src) 98.1 F (36.7 C) (Oral)  Resp 20  SpO2 100% Physical Exam  Nursing note and vitals reviewed. Constitutional: He is oriented to person, place, and time. He appears well-developed and well-nourished.  HENT:  Head: Normocephalic and atraumatic.  Eyes: Conjunctivae are normal. Right eye exhibits no discharge. Left eye exhibits no discharge.  Neck: Normal range of motion. Neck supple. No tracheal deviation present.  Cardiovascular: Normal rate and regular rhythm.   Pulmonary/Chest: Effort normal and breath sounds normal.  Abdominal: Soft. He exhibits no distension. There is no tenderness. There is no guarding.  Genitourinary:  No groin swelling, normal penis and testicle exam No prostate tenderness  Musculoskeletal: He exhibits no edema.  Neurological: He is alert and oriented to person, place, and time.  Skin: Skin is warm. No rash noted.  Psychiatric: He has a normal mood and affect.    ED Course  Procedures (including critical care time) Labs Review Labs Reviewed  URINALYSIS, ROUTINE W REFLEX MICROSCOPIC - Abnormal; Notable for the following:    Hgb urine dipstick LARGE (*)    All other components within normal limits  URINE MICROSCOPIC-ADD ON  I-STAT CHEM 8, ED    Imaging Review No results found.   EKG Interpretation None      MDM   Final diagnoses:  Hematuria   One episode of hematuria and well-appearing  healthy male. Kidney function unremarkable, hematuria and urinalysis reviewed. Discussed differential diagnosis with patient including passed kidney stone versus versus cancer versus other. He will follow closely with urology.   Results and differential diagnosis were discussed with the patient/parent/guardian. Close follow up outpatient was discussed, comfortable with the plan.   Medications - No data to display  Filed  Vitals:   01/04/14 0946  BP: 138/88  Pulse: 63  Temp: 98.1 F (36.7 C)  TempSrc: Oral  Resp: 20  SpO2: 100%      Enid Skeens, MD 01/04/14 1147

## 2014-10-08 ENCOUNTER — Emergency Department (HOSPITAL_COMMUNITY): Payer: Medicare Other

## 2014-10-08 ENCOUNTER — Encounter (HOSPITAL_COMMUNITY): Payer: Self-pay | Admitting: Emergency Medicine

## 2014-10-08 ENCOUNTER — Emergency Department (HOSPITAL_COMMUNITY)
Admission: EM | Admit: 2014-10-08 | Discharge: 2014-10-08 | Disposition: A | Discharge status: Home / Self Care | Payer: Medicare Other | Attending: Emergency Medicine | Admitting: Emergency Medicine

## 2014-10-08 ENCOUNTER — Emergency Department (HOSPITAL_BASED_OUTPATIENT_CLINIC_OR_DEPARTMENT_OTHER): Payer: Medicare Other

## 2014-10-08 DIAGNOSIS — M25561 Pain in right knee: Secondary | ICD-10-CM | POA: Diagnosis not present

## 2014-10-08 DIAGNOSIS — Y9289 Other specified places as the place of occurrence of the external cause: Secondary | ICD-10-CM | POA: Diagnosis not present

## 2014-10-08 DIAGNOSIS — M79609 Pain in unspecified limb: Secondary | ICD-10-CM

## 2014-10-08 DIAGNOSIS — Y998 Other external cause status: Secondary | ICD-10-CM | POA: Insufficient documentation

## 2014-10-08 DIAGNOSIS — M2550 Pain in unspecified joint: Secondary | ICD-10-CM

## 2014-10-08 DIAGNOSIS — S8391XA Sprain of unspecified site of right knee, initial encounter: Secondary | ICD-10-CM | POA: Diagnosis not present

## 2014-10-08 DIAGNOSIS — Y9302 Activity, running: Secondary | ICD-10-CM | POA: Diagnosis not present

## 2014-10-08 DIAGNOSIS — Z87828 Personal history of other (healed) physical injury and trauma: Secondary | ICD-10-CM | POA: Diagnosis not present

## 2014-10-08 DIAGNOSIS — Z8659 Personal history of other mental and behavioral disorders: Secondary | ICD-10-CM | POA: Insufficient documentation

## 2014-10-08 DIAGNOSIS — X58XXXA Exposure to other specified factors, initial encounter: Secondary | ICD-10-CM | POA: Diagnosis not present

## 2014-10-08 NOTE — Progress Notes (Signed)
*  Preliminary Results* Right lower extremity venous duplex completed. Right lower extremity is negative for deep vein thrombosis. There is no evidence of right Baker's cyst.  Preliminary results discussed with Raymon MuttonMarissa Sciacca, PA-C.  10/08/2014 7:25 PM  Gertie FeyMichelle Shea Swalley, RVT, RDCS, RDMS

## 2014-10-08 NOTE — ED Provider Notes (Signed)
CSN: [id number]     Arrival date & time 10/08/14  1734 History  This chart was scribed for non-physician practitioner, Raymon Mutton, PA-C, working with Zadie Rhine, MD, by Modena Jansky, ED Scribe. This patient was seen in room WTR7/WTR7 and the patient's care was started at 7:20 PM.   Chief Complaint  Patient presents with  . Knee Pain   The history is provided by the patient. No language interpreter was used.   HPI Comments: Keith Mcdonald is a 54 y.o. male with a PMHx of post concussion syndrome and right knee surgery 13 years ago who presents to the Emergency Department complaining of right knee pain that started this morning. Patient reported that the pain is localized to the lateral aspect of the right knee described as a sharp shooting pain with standing and motion, mainly extension of the knee. Patient reported that when his knee is flexed and resting there is no discomfort. Stated he is used nothing for the pain. Reported that he has not used any ice or elevation. 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 Patient reported that 3 days ago he is working in the yard, reported that he identified a copperhead hiding behind tires-patient reported that he does not believe he was bit by copperhead, but is concerned. Patient denied history of gout. 9 skin changes, drainage, fever, chills, numbness, tingling, loss of sensation, hip pain, ankle pain, for pain. PCP Dr. Cliffton Asters  Past Medical History  Diagnosis Date  . No pertinent past medical history   . Head injury   . Post concussion syndrome    Past Surgical History  Procedure Laterality Date  . No past surgeries    . Knee surgery     Family History  Problem Relation Age of Onset  . Hypertension Mother   . Hypertension Father   . Hypertension Sister    History  Substance Use Topics  . Smoking status: Current Every Day Smoker -- 0.25 packs/day    Types:  Cigarettes  . Smokeless tobacco: Not on file  . Alcohol Use: No    Review of Systems  Constitutional: Negative for fever and chills.  Musculoskeletal: Positive for myalgias and joint swelling.  Skin: Negative for color change.  Neurological: Negative for numbness.   Allergies  Bee venom  Home Medications   Prior to Admission medications   Not on File   BP 141/85 mmHg  Pulse 85  Temp(Src) 98 F (36.7 C) (Oral)  Resp 14  SpO2 98% Physical Exam  Constitutional: He is oriented to person, place, and time. He appears well-developed and well-nourished. No distress.  HENT:  Head: Normocephalic and atraumatic.  Eyes: Conjunctivae and EOM are normal. Right eye exhibits no discharge. Left eye exhibits no discharge.  Neck: Normal range of motion. Neck supple.  Cardiovascular: Normal rate, regular rhythm and normal heart sounds.  Exam reveals no friction rub.   No murmur heard. Pulses:      Radial pulses are 2+ on the right side, and 2+ on the left side.       Dorsalis pedis pulses are 2+ on the right side, and 2+ on the left side.  Cap refill < 3 seconds Negative calf tenderness Negative Homan's sign   Pulmonary/Chest: Effort normal and breath sounds normal. No respiratory distress. He has no wheezes. He has no rales.  Musculoskeletal: Normal range of motion. He exhibits tenderness. He exhibits no  edema.       Right knee: He exhibits normal range of motion, no swelling, no effusion, no ecchymosis and no deformity. Tenderness found. Lateral joint line tenderness noted.       Legs: Negative swelling, erythema, inflammation, lesions, sores, deformities, malalignments, ecchymosis noted to the right knee. Negative red streaks. Negative warmth upon palpation. Negative signs of cellulitic infection. Mild discomfort upon palpation to the lateral aspect of the right knee. Negative crepitus with motion. Patient is able to flex and extend the knee - discomfort with extension of the knee. Patella  intact. Negative anterior or posterior draw sign.   Neurological: He is alert and oriented to person, place, and time. No cranial nerve deficit. He exhibits normal muscle tone. Coordination normal.  Cranial nerves III-XII grossly intact Strength 5+/5+ to upper extremities bilaterally with resistance applied, equal distribution noted Sensation intact with differentiation to sharp and dull touch  Gait proper, proper balance - negative sway, negative drift, negative step-offs  Skin: Skin is warm and dry. No rash noted. He is not diaphoretic. No erythema.  Negative puncture wounds. Negative abscess formation - negative areas of fluctuance or induration noted. Negative areas of necrosis or color changes to the skin noted.  Psychiatric: He has a normal mood and affect. His behavior is normal. Thought content normal.  Nursing note and vitals reviewed.   ED Course  Procedures (including critical care time) DIAGNOSTIC STUDIES: Oxygen Saturation is 98% on RA, normal by my interpretation.    COORDINATION OF CARE: 7:24 PM- Pt advised of plan for treatment which includes radiology and pt agrees.  *Preliminary Results* Right lower extremity venous duplex completed. Right lower extremity is negative for deep vein thrombosis. There is no evidence of right Baker's cyst.  Preliminary results discussed with Raymon MuttonMarissa Takiera Mayo, PA-C.  10/08/2014 7:25 PM  Gertie FeyMichelle Simonetti, RVT, RDCS, RDMS   Labs Review Labs Reviewed - No data to display  Imaging Review Dg Knee Complete 4 Views Right  10/08/2014   CLINICAL DATA:  Knee pain with limited weight-bearing today. No acute injury. Initial encounter.  EXAM: RIGHT KNEE - COMPLETE 4+ VIEW  COMPARISON:  Radiographs 08/08/2014.  FINDINGS: The mineralization and alignment are normal. There is no evidence of acute fracture or dislocation. The joint spaces are maintained. No significant joint effusion or loose bodies demonstrated.  IMPRESSION: Negative right knee  radiographs.   Electronically Signed   By: Carey BullocksWilliam  Veazey M.D.   On: 10/08/2014 19:56     EKG Interpretation None      MDM   Final diagnoses:  Right knee pain  Right knee sprain, initial encounter  Arthritic-like pain    Medications - No data to display  Filed Vitals:   10/08/14 1804  BP: 141/85  Pulse: 85  Temp: 98 F (36.7 C)  TempSrc: Oral  Resp: 14  SpO2: 98%   I personally performed the services described in this documentation, which was scribed in my presence. The recorded information has been reviewed and is accurate.  Doppler of right lower extremity negative for DVT, superficial thrombosis or Baker cyst. Plain film of right knee negative for acute osseous injury. Negative findings of cellulitis. Doubt septic joint. Doubt quadriceps tendon rupture. Doubt patellar tendon rupture. Negative swelling or changes to skin colored, negative warmth upon palpation. Negative palpation of induration, fluctuance-negative findings of abscess. Negative red streaks or signs of infection noted to the knee. Negative areas of necrosis noted to the right knee. Full flexion extension identified, mild discomfort with  extension of the knee - tenderness upon palpation to the lateral aspect of the right knee. Full range of motion to the right hip, ankle, digits the toes. Sensation intact. Pulses palpable and strong. Negative puncture wounds identified, patient does have superficial abrasions to arms bilaterally from working in the yard. Negative findings of infection noted to the abrasions. Suspicion to be possible inflammation of the right knee secondary to patient applying pressure last week. Patient stable, afebrile. Patient not septic appearing. Discharged patient. Patient placed in knee sleeve for comfort purposes. Discussed the patient to rest and stay hydrated. Discussed with patient to rest, ice, elevate. Discussed with patient to avoid any physical or strenuous activity. Referred patient to  PCP and orthopedics. Discussed with patient to closely monitor symptoms and if symptoms are to worsen or change to report back to the ED - strict return instructions given.  Patient agreed to plan of care, understood, all questions answered.   Raymon MuttonMarissa Arhan Mcmanamon, PA-C 10/08/14 2058  Raymon MuttonMarissa Kaylin Schellenberg, PA-C 10/08/14 2108  Zadie Rhineonald Wickline, MD 10/08/14 2351

## 2014-10-08 NOTE — Discharge Instructions (Signed)
Please call your doctor for a followup appointment within 24-48 hours. When you talk to your doctor please let them know that you were seen in the emergency department and have them acquire all of your records so that they can discuss the findings with you and formulate a treatment plan to fully care for your new and ongoing problems. Please call and set-up an appointment with your primary care provider  Please call and set-up an appointment with Orthopedics Please rest, ice, elevate - toes above nose Keep knee sleeve on at all time for comfort purposes Please continue to monitor symptoms closely and if symptoms are to worsen or change (fever greater than 101, chills, sweating, nausea, vomiting, chest pain, shortness of breathe, difficulty breathing, weakness, numbness, tingling, worsening or changes to pain pattern, fall, injury, swelling, loss of sensation, numbness, tingling, changes to skin color of the leg and foot. Red streaks running down the leg, blackening of the skin tissue) please report back to the Emergency Department immediately.    Arthritis, Nonspecific Arthritis is inflammation of a joint. This usually means pain, redness, warmth or swelling are present. One or more joints may be involved. There are a number of types of arthritis. Your caregiver may not be able to tell what type of arthritis you have right away. CAUSES  The most common cause of arthritis is the wear and tear on the joint (osteoarthritis). This causes damage to the cartilage, which can break down over time. The knees, hips, back and neck are most often affected by this type of arthritis. Other types of arthritis and common causes of joint pain include:  Sprains and other injuries near the joint. Sometimes minor sprains and injuries cause pain and swelling that develop hours later.  Rheumatoid arthritis. This affects hands, feet and knees. It usually affects both sides of your body at the same time. It is often  associated with chronic ailments, fever, weight loss and general weakness.  Crystal arthritis. Gout and pseudo gout can cause occasional acute severe pain, redness and swelling in the foot, ankle, or knee.  Infectious arthritis. Bacteria can get into a joint through a break in overlying skin. This can cause infection of the joint. Bacteria and viruses can also spread through the blood and affect your joints.  Drug, infectious and allergy reactions. Sometimes joints can become mildly painful and slightly swollen with these types of illnesses. SYMPTOMS   Pain is the main symptom.  Your joint or joints can also be red, swollen and warm or hot to the touch.  You may have a fever with certain types of arthritis, or even feel overall ill.  The joint with arthritis will hurt with movement. Stiffness is present with some types of arthritis. DIAGNOSIS  Your caregiver will suspect arthritis based on your description of your symptoms and on your exam. Testing may be needed to find the type of arthritis:  Blood and sometimes urine tests.  X-ray tests and sometimes CT or MRI scans.  Removal of fluid from the joint (arthrocentesis) is done to check for bacteria, crystals or other causes. Your caregiver (or a specialist) will numb the area over the joint with a local anesthetic, and use a needle to remove joint fluid for examination. This procedure is only minimally uncomfortable.  Even with these tests, your caregiver may not be able to tell what kind of arthritis you have. Consultation with a specialist (rheumatologist) may be helpful. TREATMENT  Your caregiver will discuss with you treatment specific  to your type of arthritis. If the specific type cannot be determined, then the following general recommendations may apply. Treatment of severe joint pain includes:  Rest.  Elevation.  Anti-inflammatory medication (for example, ibuprofen) may be prescribed. Avoiding activities that cause increased  pain.  Only take over-the-counter or prescription medicines for pain and discomfort as recommended by your caregiver.  Cold packs over an inflamed joint may be used for 10 to 15 minutes every hour. Hot packs sometimes feel better, but do not use overnight. Do not use hot packs if you are diabetic without your caregiver's permission.  A cortisone shot into arthritic joints may help reduce pain and swelling.  Any acute arthritis that gets worse over the next 1 to 2 days needs to be looked at to be sure there is no joint infection. Long-term arthritis treatment involves modifying activities and lifestyle to reduce joint stress jarring. This can include weight loss. Also, exercise is needed to nourish the joint cartilage and remove waste. This helps keep the muscles around the joint strong. HOME CARE INSTRUCTIONS   Do not take aspirin to relieve pain if gout is suspected. This elevates uric acid levels.  Only take over-the-counter or prescription medicines for pain, discomfort or fever as directed by your caregiver.  Rest the joint as much as possible.  If your joint is swollen, keep it elevated.  Use crutches if the painful joint is in your leg.  Drinking plenty of fluids may help for certain types of arthritis.  Follow your caregiver's dietary instructions.  Try low-impact exercise such as:  Swimming.  Water aerobics.  Biking.  Walking.  Morning stiffness is often relieved by a warm shower.  Put your joints through regular range-of-motion. SEEK MEDICAL CARE IF:   You do not feel better in 24 hours or are getting worse.  You have side effects to medications, or are not getting better with treatment. SEEK IMMEDIATE MEDICAL CARE IF:   You have a fever.  You develop severe joint pain, swelling or redness.  Many joints are involved and become painful and swollen.  There is severe back pain and/or leg weakness.  You have loss of bowel or bladder control. Document  Released: 06/22/2004 Document Revised: 08/07/2011 Document Reviewed: 07/08/2008 [facility] Patient Information 2015 [place], [place]. This information is not intended to replace advice given to you by your health care provider. Make sure you discuss any questions you have with your health care provider.

## 2014-10-08 NOTE — ED Notes (Signed)
Pt c/o sharp, tender, R lateral knee pain since waking up this morning. 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 Pt sts the pain is worse upon palpation and straightening of the leg. A&Ox4 and ambulatory. Denies numbness or tingling.

## 2014-11-10 ENCOUNTER — Emergency Department (HOSPITAL_COMMUNITY): Payer: Medicare Other

## 2014-11-10 ENCOUNTER — Encounter (HOSPITAL_COMMUNITY): Payer: Self-pay | Admitting: Emergency Medicine

## 2014-11-10 ENCOUNTER — Emergency Department (HOSPITAL_COMMUNITY)
Admission: EM | Admit: 2014-11-10 | Discharge: 2014-11-10 | Disposition: A | Discharge status: Home / Self Care | Payer: Medicare Other | Attending: Emergency Medicine | Admitting: Emergency Medicine

## 2014-11-10 DIAGNOSIS — Y9289 Other specified places as the place of occurrence of the external cause: Secondary | ICD-10-CM | POA: Diagnosis not present

## 2014-11-10 DIAGNOSIS — S39012A Strain of muscle, fascia and tendon of lower back, initial encounter: Secondary | ICD-10-CM | POA: Diagnosis not present

## 2014-11-10 DIAGNOSIS — X58XXXA Exposure to other specified factors, initial encounter: Secondary | ICD-10-CM | POA: Diagnosis not present

## 2014-11-10 DIAGNOSIS — Z720 Tobacco use: Secondary | ICD-10-CM | POA: Insufficient documentation

## 2014-11-10 DIAGNOSIS — R52 Pain, unspecified: Secondary | ICD-10-CM

## 2014-11-10 DIAGNOSIS — Y9389 Activity, other specified: Secondary | ICD-10-CM | POA: Insufficient documentation

## 2014-11-10 DIAGNOSIS — Y998 Other external cause status: Secondary | ICD-10-CM | POA: Insufficient documentation

## 2014-11-10 DIAGNOSIS — M545 Low back pain, unspecified: Secondary | ICD-10-CM

## 2014-11-10 DIAGNOSIS — M47817 Spondylosis without myelopathy or radiculopathy, lumbosacral region: Secondary | ICD-10-CM | POA: Diagnosis not present

## 2014-11-10 MED ORDER — NAPROXEN 500 MG PO TABS: 500.0000 mg | ORAL_TABLET | Freq: Two times a day (BID) | ORAL | Status: DC

## 2014-11-10 MED ORDER — CYCLOBENZAPRINE HCL 10 MG PO TABS: 10.0000 mg | ORAL_TABLET | Freq: Three times a day (TID) | ORAL | Status: DC | PRN

## 2014-11-10 NOTE — ED Notes (Signed)
Pt c/o left sided back pain since Saturday denies injury. Pt states after cutting grass with push mower that when pain started

## 2014-11-10 NOTE — Discharge Instructions (Signed)
Follow up with your md in 1-2 weeks for back and bloodpressure

## 2014-11-10 NOTE — ED Provider Notes (Signed)
CSN: [id number]     Arrival date & time 11/10/14  0631 History   First MD Initiated Contact with Patient 11/10/14 [phone]     Chief Complaint  Patient presents with  . Back Pain     (Consider location/radiation/quality/duration/timing/severity/associated sxs/prior Treatment) Patient is a 54 y.o. male presenting with back pain. The history is provided by the patient (the pt complains of back pain after doing some work outside).  Back Pain Location:  Lumbar spine Quality:  Aching Pain severity:  Moderate Pain is:  Same all the time Onset quality:  Gradual Timing:  Constant Chronicity:  New Context: not emotional stress   Associated symptoms: no abdominal pain, no chest pain and no headaches     Past Medical History  Diagnosis Date  . No pertinent past medical history   . Head injury   . Post concussion syndrome    Past Surgical History  Procedure Laterality Date  . No past surgeries    . Knee surgery     Family History  Problem Relation Age of Onset  . Hypertension Mother   . Hypertension Father   . Hypertension Sister    History  Substance Use Topics  . Smoking status: Current Every Day Smoker -- 0.25 packs/day    Types: Cigarettes  . Smokeless tobacco: Not on file  . Alcohol Use: No    Review of Systems  Constitutional: Negative for appetite change and fatigue.  HENT: Negative for congestion, ear discharge and sinus pressure.   Eyes: Negative for discharge.  Respiratory: Negative for cough.   Cardiovascular: Negative for chest pain.  Gastrointestinal: Negative for abdominal pain and diarrhea.  Genitourinary: Negative for frequency and hematuria.  Musculoskeletal: Positive for back pain.  Skin: Negative for rash.  Neurological: Negative for seizures and headaches.  Psychiatric/Behavioral: Negative for hallucinations.      Allergies  Bee venom  Home Medications   Prior to Admission medications   Medication Sig Start Date End Date Taking? Authorizing  Provider  Phenyleph-Doxylamine-DM-APAP (ALKA-SELTZER PLUS COLD & FLU) 10-12.5-20-650 MG PACK Take 1-2 tablets by mouth 2 (two) times daily as needed (for cold symptoms/preventative).   Yes Historical Provider, MD  cyclobenzaprine (FLEXERIL) 10 MG tablet Take 1 tablet (10 mg total) by mouth 3 (three) times daily as needed for muscle spasms. 11/10/14   Bethann Berkshire, MD  naproxen (NAPROSYN) 500 MG tablet Take 1 tablet (500 mg total) by mouth 2 (two) times daily. 11/10/14   Bethann Berkshire, MD   BP 149/97 mmHg  Pulse 71  Temp(Src) 97.9 F (36.6 C) (Oral)  Resp 20  SpO2 100% Physical Exam  Constitutional: He is oriented to person, place, and time. He appears well-developed.  HENT:  Head: Normocephalic.  Eyes: Conjunctivae and EOM are normal. No scleral icterus.  Neck: Neck supple. No thyromegaly present.  Cardiovascular: Normal rate and regular rhythm.  Exam reveals no gallop and no friction rub.   No murmur heard. Pulmonary/Chest: No stridor. He has no wheezes. He has no rales. He exhibits no tenderness.  Abdominal: He exhibits no distension. There is no tenderness. There is no rebound.  Musculoskeletal: Normal range of motion. He exhibits tenderness. He exhibits no edema.  Tender lumbar muscles  Lymphadenopathy:    He has no cervical adenopathy.  Neurological: He is oriented to person, place, and time. He exhibits normal muscle tone. Coordination normal.  Skin: No rash noted. No erythema.  Psychiatric: He has a normal mood and affect. His behavior is normal.  ED Course  Procedures (including critical care time) Labs Review Labs Reviewed - No data to display  Imaging Review Dg Lumbar Spine Complete  11/10/2014   CLINICAL DATA:  54 year old male with lumbar back pain since mo Ing the lawn 3 days ago. Initial encounter.  EXAM: LUMBAR SPINE - COMPLETE 4+ VIEW  COMPARISON:  CT Abdomen and Pelvis 08/07/2004  FINDINGS: Progressed degenerative endplate osteophytosis at L4-L5. Normal lumbar  segmentation. Stable lumbar vertebral height and alignment. Stable disc spaces since 2006. Visible lower thoracic levels appear intact. No pars fracture. Mild to moderate facet hypertrophy and sclerosis at L5-S1. Sacral ala and SI joints within normal limits.  IMPRESSION: No acute osseous abnormality identified in the lumbar spine.  Progressed L4-L5 endplate and L5-S1 facet degeneration since 2006.   Electronically Signed   By: Odessa Fleming M.D.   On: 11/10/2014 07:45     EKG Interpretation None      MDM   Final diagnoses:  Pain  Back pain at L4-L5 level    X-rays reviewed,   Lumbar strain  tx with naprosyn and flexeril    Bethann Berkshire, MD 11/10/14 [phone]

## 2014-11-11 DIAGNOSIS — M549 Dorsalgia, unspecified: Secondary | ICD-10-CM | POA: Diagnosis not present

## 2014-11-11 DIAGNOSIS — R030 Elevated blood-pressure reading, without diagnosis of hypertension: Secondary | ICD-10-CM | POA: Diagnosis not present

## 2015-02-24 DIAGNOSIS — Z1211 Encounter for screening for malignant neoplasm of colon: Secondary | ICD-10-CM | POA: Diagnosis not present

## 2015-02-24 DIAGNOSIS — Z0000 Encounter for general adult medical examination without abnormal findings: Secondary | ICD-10-CM | POA: Diagnosis not present

## 2015-02-24 DIAGNOSIS — Z131 Encounter for screening for diabetes mellitus: Secondary | ICD-10-CM | POA: Diagnosis not present

## 2015-02-24 DIAGNOSIS — H9193 Unspecified hearing loss, bilateral: Secondary | ICD-10-CM | POA: Diagnosis not present

## 2015-02-24 DIAGNOSIS — F17210 Nicotine dependence, cigarettes, uncomplicated: Secondary | ICD-10-CM | POA: Diagnosis not present

## 2015-02-24 DIAGNOSIS — Z87820 Personal history of traumatic brain injury: Secondary | ICD-10-CM | POA: Diagnosis not present

## 2015-02-24 DIAGNOSIS — Z125 Encounter for screening for malignant neoplasm of prostate: Secondary | ICD-10-CM | POA: Diagnosis not present

## 2015-02-24 DIAGNOSIS — Z136 Encounter for screening for cardiovascular disorders: Secondary | ICD-10-CM | POA: Diagnosis not present

## 2015-03-04 DIAGNOSIS — H903 Sensorineural hearing loss, bilateral: Secondary | ICD-10-CM | POA: Diagnosis not present

## 2015-04-16 DIAGNOSIS — Z1211 Encounter for screening for malignant neoplasm of colon: Secondary | ICD-10-CM | POA: Diagnosis not present

## 2016-06-21 ENCOUNTER — Encounter (HOSPITAL_COMMUNITY): Payer: Self-pay | Admitting: Family Medicine

## 2016-06-21 ENCOUNTER — Emergency Department (HOSPITAL_COMMUNITY)
Admission: EM | Admit: 2016-06-21 | Discharge: 2016-06-22 | Disposition: A | Discharge status: Home / Self Care | Payer: Medicare HMO | Attending: Emergency Medicine | Admitting: Emergency Medicine

## 2016-06-21 DIAGNOSIS — M542 Cervicalgia: Secondary | ICD-10-CM | POA: Diagnosis not present

## 2016-06-21 DIAGNOSIS — Y939 Activity, unspecified: Secondary | ICD-10-CM | POA: Diagnosis not present

## 2016-06-21 DIAGNOSIS — Y999 Unspecified external cause status: Secondary | ICD-10-CM | POA: Diagnosis not present

## 2016-06-21 DIAGNOSIS — Y92410 Unspecified street and highway as the place of occurrence of the external cause: Secondary | ICD-10-CM | POA: Insufficient documentation

## 2016-06-21 DIAGNOSIS — F17210 Nicotine dependence, cigarettes, uncomplicated: Secondary | ICD-10-CM | POA: Insufficient documentation

## 2016-06-21 DIAGNOSIS — S20211A Contusion of right front wall of thorax, initial encounter: Secondary | ICD-10-CM | POA: Diagnosis not present

## 2016-06-21 DIAGNOSIS — R0781 Pleurodynia: Secondary | ICD-10-CM | POA: Diagnosis not present

## 2016-06-21 DIAGNOSIS — S199XXA Unspecified injury of neck, initial encounter: Secondary | ICD-10-CM | POA: Diagnosis present

## 2016-06-21 DIAGNOSIS — S161XXA Strain of muscle, fascia and tendon at neck level, initial encounter: Secondary | ICD-10-CM | POA: Diagnosis not present

## 2016-06-21 DIAGNOSIS — S299XXA Unspecified injury of thorax, initial encounter: Secondary | ICD-10-CM | POA: Diagnosis not present
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
## 2016-06-22 ENCOUNTER — Emergency Department (HOSPITAL_COMMUNITY): Payer: Medicare HMO

## 2016-06-22 DIAGNOSIS — R0781 Pleurodynia: Secondary | ICD-10-CM | POA: Diagnosis not present

## 2016-06-22 DIAGNOSIS — M542 Cervicalgia: Secondary | ICD-10-CM | POA: Diagnosis not present

## 2016-06-22 DIAGNOSIS — S299XXA Unspecified injury of thorax, initial encounter: Secondary | ICD-10-CM | POA: Diagnosis not present

## 2016-06-22 MED ORDER — TRAMADOL HCL 50 MG PO TABS
50.0000 mg | ORAL_TABLET | Freq: Four times a day (QID) | ORAL | 0 refills | Status: DC | PRN
Start: 2016-06-22 — End: 2019-04-19

## 2016-06-22 MED ORDER — IBUPROFEN 800 MG PO TABS: 800.0000 mg | ORAL_TABLET | Freq: Three times a day (TID) | ORAL | 0 refills | Status: DC | PRN

## 2016-06-22 NOTE — Discharge Instructions (Signed)
Return here as needed.  Use ice and heat on the areas that are sore.  Your x-rays did not show any abnormality
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
## 2016-07-03 DIAGNOSIS — Z87820 Personal history of traumatic brain injury: Secondary | ICD-10-CM | POA: Diagnosis not present

## 2016-07-03 DIAGNOSIS — M545 Low back pain: Secondary | ICD-10-CM | POA: Diagnosis not present

## 2016-07-12 ENCOUNTER — Ambulatory Visit (INDEPENDENT_AMBULATORY_CARE_PROVIDER_SITE_OTHER): Payer: Medicare HMO | Admitting: Sports Medicine

## 2016-07-12 ENCOUNTER — Encounter: Payer: Self-pay | Admitting: Sports Medicine

## 2016-07-12 DIAGNOSIS — M5030 Other cervical disc degeneration, unspecified cervical region: Secondary | ICD-10-CM

## 2016-07-12 MED ORDER — PREDNISONE 50 MG PO TABS: ORAL_TABLET | ORAL | 0 refills | Status: DC

## 2016-07-12 MED ORDER — CYCLOBENZAPRINE HCL 10 MG PO TABS: ORAL_TABLET | ORAL | 0 refills | Status: DC
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
## 2016-07-12 NOTE — Patient Instructions (Signed)
Cervical Sprain A cervical sprain is a stretch or tear in one or more of the tough, cord-like tissues that connect bones (ligaments) in the neck. Cervical sprains can range from mild to severe. Severe cervical sprains can cause the spinal bones (vertebrae) in the neck to be unstable. This can lead to spinal cord damage and can result in serious nervous system problems. The amount of time that it takes for a cervical sprain to get better depends on the cause and extent of the injury. Most cervical sprains heal in 4-6 weeks. What are the causes? Cervical sprains may be caused by an injury (trauma), such as from a motor vehicle accident, a fall, or sudden forward and backward whipping movement of the head and neck (whiplash injury). Mild cervical sprains may be caused by wear and tear over time, such as from poor posture, sitting in a chair that does not provide support, or looking up or down for long periods of time. What increases the risk? The following factors may make you more likely to develop this condition:  Participating in activities that have a high risk of trauma to the neck. These include contact sports, auto racing, gymnastics, and diving.  Taking risks when driving or riding in a motor vehicle, such as speeding.  Having osteoarthritis of the spine.  Having poor strength and flexibility of the neck.  A previous neck injury.  Having poor posture.  Spending a lot of time in certain positions that put stress on the neck, such as sitting at a computer for long periods of time. What are the signs or symptoms? Symptoms of this condition include:  Pain, soreness, stiffness, tenderness, swelling, or a burning sensation in the front, back, or sides of the neck.  Sudden tightening of neck muscles that you cannot control (muscle spasms).  Pain in the shoulders or upper back.  Limited ability to move the neck.  Headache.  Dizziness.  Nausea.  Vomiting.  Weakness, numbness, or  tingling in a hand or an arm. Symptoms may develop right away after injury, or they may develop over a few days. In some cases, symptoms may go away with treatment and return (recur) over time. How is this diagnosed? This condition may be diagnosed based on:  Your medical history.  Your symptoms.  Any recent injuries or known neck problems that you have, such as arthritis in the neck.  A physical exam.  Imaging tests, such as:  X-rays.  MRI.  CT scan. How is this treated? This condition is treated by resting and icing the injured area and doing physical therapy exercises. Depending on the severity of your condition, treatment may also include:  Keeping your neck in place (immobilized) for periods of time. This may be done using:  A cervical collar. This supports your chin and the back of your head.  A cervical traction device. This is a sling that holds up your head. This removes weight and pressure from your neck, and it may help to relieve pain.  Medicines that help to relieve pain and inflammation.  Medicines that help to relax your muscles (muscle relaxants).  Surgery. This is rare. Follow these instructions at home: If you have a cervical collar:   Wear it as told by your health care provider. Do not remove the collar unless instructed by your health care provider.  Ask your health care provider before you make any adjustments to your collar.  If you have long hair, keep it outside of the collar.    Ask your health care provider if you can remove the collar for cleaning and bathing. If you are allowed to remove the collar for cleaning or bathing:  Follow instructions from your health care provider about how to remove the collar safely.  Clean the collar by wiping it with mild soap and water and drying it completely.  If your collar has removable pads, remove them every 1-2 days and wash them by hand with soap and water. Let them air-dry completely before you put  them back in the collar.  Check your skin under the collar for irritation or sores. If you see any, tell your health care provider. Managing pain, stiffness, and swelling   If directed, use a cervical traction device as told by your health care provider.  If directed, apply heat to the affected area before you do your physical therapy or as often as told by your health care provider. Use the heat source that your health care provider recommends, such as a moist heat pack or a heating pad.  Place a towel between your skin and the heat source.  Leave the heat on for 20-30 minutes.  Remove the heat if your skin turns bright red. This is especially important if you are unable to feel pain, heat, or cold. You may have a greater risk of getting burned.  If directed, put ice on the affected area:  Put ice in a plastic bag.  Place a towel between your skin and the bag.  Leave the ice on for 20 minutes, 2-3 times a day. Activity   Do not drive while wearing a cervical collar. If you do not have a cervical collar, ask your health care provider if it is safe to drive while your neck heals.  Do not drive or use heavy machinery while taking prescription pain medicine or muscle relaxants, unless your health care provider approves.  Do not lift anything that is heavier than 10 lb (4.5 kg) until your health care provider tells you that it is safe.  Rest as directed by your health care provider. Avoid positions and activities that make your symptoms worse. Ask your health care provider what activities are safe for you.  If physical therapy was prescribed, do exercises as told by your health care provider or physical therapist. General instructions   Take over-the-counter and prescription medicines only as told by your health care provider.  Do not use any products that contain nicotine or tobacco, such as cigarettes and e-cigarettes. These can delay healing. If you need help quitting, ask your  health care provider.  Keep all follow-up visits as told by your health care provider or physical therapist. This is important. How is this prevented? To prevent a cervical sprain from happening again:  Use and maintain good posture. Make any needed adjustments to your workstation to help you use good posture.  Exercise regularly as directed by your health care provider or physical therapist.  Avoid risky activities that may cause a cervical sprain. Contact a health care provider if:  You have symptoms that get worse or do not get better after 2 weeks of treatment.  You have pain that gets worse or does not get better with medicine.  You develop new, unexplained symptoms.  You have sores or irritated skin on your neck from wearing your cervical collar. Get help right away if:  You have severe pain.  You develop numbness, tingling, or weakness in any part of your body.  You cannot move   a part of your body (you have paralysis).  You have neck pain along with:  Severe dizziness.  Headache. Summary  A cervical sprain is a stretch or tear in one or more of the tough, cord-like tissues that connect bones (ligaments) in the neck.  Cervical sprains may be caused by an injury (trauma), such as from a motor vehicle accident, a fall, or sudden forward and backward whipping movement of the head and neck (whiplash injury).  Symptoms may develop right away after injury, or they may develop over a few days.  This condition is treated by resting and icing the injured area and doing physical therapy exercises. This information is not intended to replace advice given to you by your health care provider. Make sure you discuss any questions you have with your health care provider. Document Released: 03/12/2007 Document Revised: 01/12/2016 Document Reviewed: 01/12/2016 Elsevier Interactive Patient Education  2017 [employer]  
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
## 2017-04-24 DIAGNOSIS — I10 Essential (primary) hypertension: Secondary | ICD-10-CM | POA: Diagnosis not present

## 2017-04-24 DIAGNOSIS — J069 Acute upper respiratory infection, unspecified: Secondary | ICD-10-CM | POA: Diagnosis not present

## 2017-04-24 DIAGNOSIS — J0100 Acute maxillary sinusitis, unspecified: Secondary | ICD-10-CM | POA: Diagnosis not present

## 2017-07-08 DIAGNOSIS — J069 Acute upper respiratory infection, unspecified: Secondary | ICD-10-CM | POA: Diagnosis not present

## 2017-10-29 DIAGNOSIS — L03011 Cellulitis of right finger: Secondary | ICD-10-CM | POA: Diagnosis not present

## 2017-11-11 DIAGNOSIS — J069 Acute upper respiratory infection, unspecified: Secondary | ICD-10-CM | POA: Diagnosis not present

## 2017-12-01 DIAGNOSIS — R590 Localized enlarged lymph nodes: Secondary | ICD-10-CM | POA: Diagnosis not present

## 2017-12-01 DIAGNOSIS — J029 Acute pharyngitis, unspecified: Secondary | ICD-10-CM | POA: Diagnosis not present

## 2018-01-06 ENCOUNTER — Emergency Department (HOSPITAL_COMMUNITY)
Admission: EM | Admit: 2018-01-06 | Discharge: 2018-01-07 | Disposition: A | Discharge status: Home / Self Care | Payer: Medicare HMO | Attending: Emergency Medicine | Admitting: Emergency Medicine

## 2018-01-06 ENCOUNTER — Encounter (HOSPITAL_COMMUNITY): Payer: Self-pay | Admitting: Emergency Medicine

## 2018-01-06 ENCOUNTER — Emergency Department (HOSPITAL_COMMUNITY): Payer: Medicare HMO

## 2018-01-06 DIAGNOSIS — F1410 Cocaine abuse, uncomplicated: Secondary | ICD-10-CM | POA: Insufficient documentation

## 2018-01-06 DIAGNOSIS — F17210 Nicotine dependence, cigarettes, uncomplicated: Secondary | ICD-10-CM | POA: Diagnosis not present

## 2018-01-06 DIAGNOSIS — R079 Chest pain, unspecified: Secondary | ICD-10-CM | POA: Insufficient documentation

## 2018-01-06 DIAGNOSIS — R9431 Abnormal electrocardiogram [ECG] [EKG]: Secondary | ICD-10-CM | POA: Diagnosis not present

## 2018-01-06 LAB — CBC
HCT: 42.1 % (ref 39.0–52.0)
HEMOGLOBIN: 13.9 g/dL (ref 13.0–17.0)
MCH: 29.6 pg (ref 26.0–34.0)
MCHC: 33 g/dL (ref 30.0–36.0)
MCV: 89.6 fL (ref 78.0–100.0)
PLATELETS: 326 10*3/uL (ref 150–400)
RBC: 4.7 MIL/uL (ref 4.22–5.81)
RDW: 14.5 % (ref 11.5–15.5)
WBC: 9.5 10*3/uL (ref 4.0–10.5)

## 2018-01-06 LAB — BASIC METABOLIC PANEL
ANION GAP: 10 (ref 5–15)
BUN: 21 mg/dL — ABNORMAL HIGH (ref 6–20)
CALCIUM: 9.2 mg/dL (ref 8.9–10.3)
CO2: 23 mmol/L (ref 22–32)
CREATININE: 1.05 mg/dL (ref 0.61–1.24)
Chloride: 109 mmol/L (ref 98–111)
GFR calc Af Amer: >60 mL/min (ref >60)
Glucose, Bld: 82 mg/dL (ref 70–99)
Potassium: 4 mmol/L (ref 3.5–5.1)
SODIUM: 142 mmol/L (ref 135–145)

## 2018-01-06 LAB — I-STAT TROPONIN, ED: Troponin i, poc: 0 ng/mL (ref 0.00–0.08)

## 2018-01-06 NOTE — ED Triage Notes (Signed)
Pt reports left sided sharp chest pains that started yesterday.  Tonight they have been intensifying. Denies n/v/sob, dizziness.

## 2018-01-07 LAB — I-STAT TROPONIN, ED: Troponin i, poc: 0 ng/mL (ref 0.00–0.08)

## 2018-01-07 NOTE — Discharge Instructions (Signed)
No dangerous cause of chest pain was found tonight.  Please follow-up with your doctor.  Return for new or worsening symptoms.

## 2018-01-07 NOTE — ED Notes (Signed)
ED Provider at bedside. 

## 2018-01-07 NOTE — ED Provider Notes (Signed)
[facility] EMERGENCY DEPARTMENT Provider Note   CSN: [id number] Arrival date & time: 01/06/18  2132     History   Chief Complaint Chief Complaint  Patient presents with  . Chest Pain    HPI Keith Mcdonald is a 57 y.o. male.  Patient with no pertinent past medical history presents to the emergency department with a chief complaint of chest pain.  He states that over the past 2 days he has had multiple brief (less than 2 seconds).  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  He questions whether he just strained something, but wanted to be certain that it was nothing more serious due to the recurrence of his symptoms.  He has not taken anything for his symptoms.  He denies any symptoms now.  There are no aggravating factors.  He denies any shortness of breath, radiating pain, or diaphoresis.  The history is provided by the patient. No language interpreter was used.    Past Medical History:  Diagnosis Date  . Head injury   . No pertinent past medical history   . Post concussion syndrome     Patient Active Problem List   Diagnosis Date Noted  . DDD (degenerative disc disease), cervical 07/12/2016    Past Surgical History:  Procedure Laterality Date  . KNEE SURGERY    . NO PAST SURGERIES          Home Medications    Prior to Admission medications   Medication Sig Start Date End Date Taking? Authorizing Provider  cyclobenzaprine (FLEXERIL) 10 MG tablet One half tab PO qHS, then increase gradually to one tab TID. 07/12/16   Monica Becton, MD  ibuprofen (ADVIL,MOTRIN) 800 MG tablet Take 1 tablet (800 mg total) by mouth every 8 (eight) hours as needed. 06/22/16   Lawyer, Cristal Deer, PA-C  Phenyleph-Doxylamine-DM-APAP (ALKA-SELTZER PLUS COLD & FLU) 10-12.5-20-650 MG PACK Take 1-2 tablets by mouth 2 (two) times daily as needed (for cold symptoms/preventative).    [provider]  predniSONE  (DELTASONE) 50 MG tablet One tab PO daily for 5 days. 07/12/16   Monica Becton, MD  traMADol (ULTRAM) 50 MG tablet Take 1 tablet (50 mg total) by mouth every 6 (six) hours as needed for severe pain. 06/22/16   Charlestine Night, PA-C    Family History Family History  Problem Relation Age of Onset  . Hypertension Mother   . Hypertension Father   . Hypertension Sister     Social History Social History   Tobacco Use  . Smoking status: Current Every Day Smoker    Packs/day: 0.25    Types: Cigarettes  . Smokeless tobacco: Never Used  Substance Use Topics  . Alcohol use: No  . Drug use: No    Types: "Crack" cocaine     Allergies   Bee venom   Review of Systems Review of Systems  All other systems reviewed and are negative.    Physical Exam Updated Vital Signs BP 130/76 (BP Location: Right Arm)   Pulse 81   Temp 98.3 F (36.8 C) (Oral)   Resp 18   Ht 6' (1.829 m)   Wt 108.9 kg   SpO2 99%   BMI 32.55 kg/m   Physical Exam  Constitutional: He is oriented to person, place, and time. He appears well-developed and well-nourished.  HENT:  Head: Normocephalic and atraumatic.  Eyes: Pupils are equal, round, and reactive to  light. Conjunctivae and EOM are normal. Right eye exhibits no discharge. Left eye exhibits no discharge. No scleral icterus.  Neck: Normal range of motion. Neck supple. No JVD present.  Cardiovascular: Normal rate, regular rhythm and normal heart sounds. Exam reveals no gallop and no friction rub.  No murmur heard. Pulmonary/Chest: Effort normal and breath sounds normal. No respiratory distress. He has no wheezes. He has no rales. He exhibits no tenderness.  Abdominal: Soft. He exhibits no distension and no mass. There is no tenderness. There is no rebound and no guarding.  Musculoskeletal: Normal range of motion. He exhibits no edema or tenderness.  Neurological: He is alert and oriented to person, place, and time.  Skin: Skin is warm and  dry.  Psychiatric: He has a normal mood and affect. His behavior is normal. Judgment and thought content normal.  Nursing note and vitals reviewed.    ED Treatments / Results  Labs (all labs ordered are listed, but only abnormal results are displayed) Labs Reviewed  BASIC METABOLIC PANEL - Abnormal; Notable for the following components:      Result Value   BUN 21 (*)    All other components within normal limits  CBC  I-STAT TROPONIN, ED  I-STAT TROPONIN, ED    EKG EKG Interpretation  Date/Time:  Sunday January 06 2018 21:34:52 EDT Ventricular Rate:  88 PR Interval:  156 QRS Duration: 88 QT Interval:  346 QTC Calculation: 418 R Axis:   98 Text Interpretation:  Normal sinus rhythm Rightward axis Abnormal QRS-T angle, consider primary T wave abnormality Abnormal ECG Confirmed by Zadie RhineWickline, Donald ([id number]) on 01/07/2018 1:13:24 AM   Radiology Dg Chest 2 View  Result Date: 01/06/2018 CLINICAL DATA:  Left-sided chest pain beginning this afternoon. EXAM: CHEST - 2 VIEW COMPARISON:  06/22/2016 FINDINGS: Shallow inspiration. Normal heart size and pulmonary vascularity. No focal airspace disease or consolidation in the lungs. No blunting of costophrenic angles. No pneumothorax. Mediastinal contours appear intact. IMPRESSION: No active cardiopulmonary disease. Electronically Signed   By: Burman NievesWilliam  Stevens M.D.   On: 01/06/2018 22:19    Procedures Procedures (including critical care time)  Medications Ordered in ED Medications - No data to display   Initial Impression / Assessment and Plan / ED Course  I have reviewed the triage vital signs and the nursing notes.  Pertinent labs & imaging results that were available during my care of the patient were reviewed by me and considered in my medical decision making (see chart for details).    Patient presents with chest pain, which was several brief twinges of pain over the past 2 days.  Review of prior visits show no prior ACS.  DDx  includes ACS, PE, pneumothorax, aortic dissection, esophageal rupture, pericarditis, chest wall pain.  Doubt ACS, normal troponin, no ischemic EKG findings, HEART score is: 2.  Low risk for PE, Well's PE score is 0, patient is not tachycardic nor hypoxic.  No evidence of pneumothorax on CXR.  Doubt dissection, no mediastinal widening on CXR, no ripping/tearing chest pain, neurovascularly intact.  Doubt pericarditis, no positional changes, or diffuse ST elevations on EKG.    Delta troponin is negative.  Patient is currently pain free.  Recommend close follow-up with PCP or ED if symptoms change or worsen.  All findings were discussed with patient.  Patient understands and agrees with the plan.      Final Clinical Impressions(s) / ED Diagnoses   Final diagnoses:  Chest pain, unspecified type    ED  Discharge Orders    None       Roxy HorsemanBrowning, Amarria Andreasen, PA-C 01/07/18 0431    Zadie RhineWickline, Donald, MD 01/07/18 [phone]

## 2018-01-07 NOTE — ED Notes (Signed)
Pt verbalizes understanding of d/c instructions. Pt ambulatory at d/c with all belongings and with family.   

## 2018-12-31 ENCOUNTER — Other Ambulatory Visit: Payer: Self-pay

## 2018-12-31 DIAGNOSIS — R6889 Other general symptoms and signs: Secondary | ICD-10-CM | POA: Diagnosis not present

## 2018-12-31 DIAGNOSIS — Z20822 Contact with and (suspected) exposure to covid-19: Secondary | ICD-10-CM

## 2019-01-01 LAB — NOVEL CORONAVIRUS, NAA: SARS-CoV-2, NAA: NOT DETECTED

## 2019-03-06 ENCOUNTER — Other Ambulatory Visit: Payer: Self-pay | Admitting: Family Medicine

## 2019-03-06 DIAGNOSIS — M25462 Effusion, left knee: Secondary | ICD-10-CM | POA: Diagnosis not present

## 2019-03-09 ENCOUNTER — Ambulatory Visit
Admission: RE | Admit: 2019-03-09 | Discharge: 2019-03-09 | Disposition: A | Discharge status: Home / Self Care | Payer: Medicare HMO | Source: Ambulatory Visit | Attending: Family Medicine | Admitting: Family Medicine

## 2019-03-09 ENCOUNTER — Other Ambulatory Visit: Payer: Self-pay

## 2019-03-09 DIAGNOSIS — M25462 Effusion, left knee: Secondary | ICD-10-CM

## 2019-03-09 DIAGNOSIS — M23342 Other meniscus derangements, anterior horn of lateral meniscus, left knee: Secondary | ICD-10-CM | POA: Diagnosis not present

## 2019-03-14 ENCOUNTER — Other Ambulatory Visit: Payer: Self-pay

## 2019-03-14 ENCOUNTER — Encounter: Payer: Self-pay | Admitting: Orthopaedic Surgery

## 2019-03-14 ENCOUNTER — Ambulatory Visit (INDEPENDENT_AMBULATORY_CARE_PROVIDER_SITE_OTHER): Payer: Medicare HMO | Admitting: Orthopaedic Surgery

## 2019-03-14 ENCOUNTER — Ambulatory Visit (INDEPENDENT_AMBULATORY_CARE_PROVIDER_SITE_OTHER): Payer: Medicare HMO

## 2019-03-14 DIAGNOSIS — M1712 Unilateral primary osteoarthritis, left knee: Secondary | ICD-10-CM

## 2019-03-14 DIAGNOSIS — M25462 Effusion, left knee: Secondary | ICD-10-CM

## 2019-03-14 NOTE — Progress Notes (Signed)
Office Visit Note   Patient: Keith Mcdonald           Date of Birth: Sep 12, 1960           MRN: [id number] Visit Date: 03/14/2019              Requested by: Deatra James, MD [phone] [address] [place],  [place] [address] PCP: Deatra James, MD   Assessment & Plan: Visit Diagnoses:  1. Primary osteoarthritis of left knee   2. Effusion, left knee     Plan: Impression is left knee DJD with degenerative lateral meniscus tear and joint effusion.  He does have evidence of tricompartmental DJD on MRI.  Effusion was aspirated today and injection was performed.  Compression wrap applied.  Patient tolerated this well.  Questions encouraged and answered.  Follow-up as needed.  Follow-Up Instructions: Return if symptoms worsen or fail to improve.   Orders:  Orders Placed This Encounter  Procedures  . XR Knee Complete 4 Views Left   No orders of the defined types were placed in this encounter.     Procedures: No procedures performed   Clinical Data: No additional findings.   Subjective: Chief Complaint  Patient presents with  . Left Knee - Pain    [name] is a very pleasant 58 year old gentleman who comes in for evaluation for left knee pain and swelling for the last couple weeks.  He is accompanied by his wife.  He states that he does not recall any specific injuries but shortly after he used his bobcat his left knee swelled up.  Since then he has been taking meloxicam with some mild relief.  He feels like the knee wants to give out.  He also endorses some numbness burning tingling and cracking.  He endorses swelling although this has gotten somewhat better.   Review of Systems  Constitutional: Negative.   All other systems reviewed and are negative.    Objective: Vital Signs: There were no vitals taken for this visit.  Physical Exam Vitals signs and nursing note reviewed.  Constitutional:      Appearance: He is well-developed.  HENT:     Head: Normocephalic and  atraumatic.  Eyes:     Pupils: Pupils are equal, round, and reactive to light.  Neck:     Musculoskeletal: Neck supple.  Pulmonary:     Effort: Pulmonary effort is normal.  Abdominal:     Palpations: Abdomen is soft.  Musculoskeletal: Normal range of motion.  Skin:    General: Skin is warm.  Neurological:     Mental Status: He is alert and oriented to person, place, and time.  Psychiatric:        Behavior: Behavior normal.        Thought Content: Thought content normal.        Judgment: Judgment normal.     Ortho Exam Left knee exam shows a moderate joint effusion.  Collaterals and cruciates are stable.  No significant joint line tenderness.  Normal range of motion. Specialty Comments:  No specialty comments available.  Imaging: Xr Knee Complete 4 Views Left  Result Date: 03/14/2019 Moderate osteoarthritis    PMFS History: Patient Active Problem List   Diagnosis Date Noted  . Primary osteoarthritis of left knee 03/14/2019  . Effusion, left knee 03/14/2019  . DDD (degenerative disc disease), cervical 07/12/2016   Past Medical History:  Diagnosis Date  . Head injury   . No pertinent past medical history   .  Post concussion syndrome     Family History  Problem Relation Age of Onset  . Hypertension Mother   . Hypertension Father   . Hypertension Sister     Past Surgical History:  Procedure Laterality Date  . KNEE SURGERY    . NO PAST SURGERIES     Social History   Occupational History  . Not on file  Tobacco Use  . Smoking status: Current Every Day Smoker    Packs/day: 0.25    Types: Cigarettes  . Smokeless tobacco: Never Used  Substance and Sexual Activity  . Alcohol use: No  . Drug use: No    Types: "Crack" cocaine  . Sexual activity: Not on file

## 2019-03-23 ENCOUNTER — Other Ambulatory Visit: Payer: Medicare HMO

## 2019-04-19 ENCOUNTER — Encounter (HOSPITAL_BASED_OUTPATIENT_CLINIC_OR_DEPARTMENT_OTHER): Payer: Self-pay | Admitting: Emergency Medicine

## 2019-04-19 ENCOUNTER — Other Ambulatory Visit: Payer: Self-pay

## 2019-04-19 ENCOUNTER — Emergency Department (HOSPITAL_BASED_OUTPATIENT_CLINIC_OR_DEPARTMENT_OTHER)
Admission: EM | Admit: 2019-04-19 | Discharge: 2019-04-19 | Disposition: A | Discharge status: Home / Self Care | Payer: Medicare HMO | Attending: Emergency Medicine | Admitting: Emergency Medicine

## 2019-04-19 ENCOUNTER — Emergency Department (HOSPITAL_BASED_OUTPATIENT_CLINIC_OR_DEPARTMENT_OTHER): Payer: Medicare HMO

## 2019-04-19 DIAGNOSIS — F17210 Nicotine dependence, cigarettes, uncomplicated: Secondary | ICD-10-CM | POA: Insufficient documentation

## 2019-04-19 DIAGNOSIS — Z91030 Bee allergy status: Secondary | ICD-10-CM | POA: Diagnosis not present

## 2019-04-19 DIAGNOSIS — R112 Nausea with vomiting, unspecified: Secondary | ICD-10-CM | POA: Insufficient documentation

## 2019-04-19 DIAGNOSIS — R42 Dizziness and giddiness: Secondary | ICD-10-CM | POA: Insufficient documentation

## 2019-04-19 LAB — BASIC METABOLIC PANEL
Anion gap: 8 (ref 5–15)
BUN: 17 mg/dL (ref 6–20)
CO2: 25 mmol/L (ref 22–32)
Calcium: 9.4 mg/dL (ref 8.9–10.3)
Chloride: 107 mmol/L (ref 98–111)
Creatinine, Ser: 0.96 mg/dL (ref 0.61–1.24)
GFR calc Af Amer: >60 mL/min (ref >60)
GFR calc non Af Amer: >60 mL/min (ref >60)
Glucose, Bld: 90 mg/dL (ref 70–99)
Potassium: 4.1 mmol/L (ref 3.5–5.1)
Sodium: 140 mmol/L (ref 135–145)

## 2019-04-19 LAB — CBC
HCT: 44.7 % (ref 39.0–52.0)
Hemoglobin: 14.9 g/dL (ref 13.0–17.0)
MCH: 29.7 pg (ref 26.0–34.0)
MCHC: 33.3 g/dL (ref 30.0–36.0)
MCV: 89.2 fL (ref 80.0–100.0)
Platelets: 329 10*3/uL (ref 150–400)
RBC: 5.01 MIL/uL (ref 4.22–5.81)
RDW: 14.1 % (ref 11.5–15.5)
WBC: 6.7 10*3/uL (ref 4.0–10.5)
nRBC: 0 % (ref 0.0–0.2)

## 2019-04-19 MED ORDER — MECLIZINE HCL 25 MG PO TABS: 25.0000 mg | ORAL_TABLET | Freq: Three times a day (TID) | ORAL | 0 refills | Status: DC | PRN

## 2019-04-19 NOTE — ED Triage Notes (Signed)
Pt reports feeling dizzy around 0200. Pt states "room was spinning". Pt reports getting up and vomiting, then fall into tub, denies head injury. Pt denies dizziness, reports feeling lightheaded at this time. Pt denies HA, reports feeling pressure behind eyes. Pt reports hx of TBI x 10 yrs ago, hx of short term memory loss. Pt ambulatory, normal gait

## 2019-04-19 NOTE — ED Provider Notes (Signed)
[facility] EMERGENCY DEPARTMENT Provider Note   CSN: [id number] Arrival date & time: 04/19/19  1317     History   Chief Complaint Chief Complaint  Patient presents with  . Emesis  . Dizziness    HPI Keith Mcdonald is a 58 y.o. male with hx of TBI who presents with dizziness, N/V.  Patient is accompanied by his wife at bedside.  Patient states that around 2 AM he got up to go to the bathroom and had severe dizziness with positional changes.  His wife assisted him to the bathroom and he was stumbling and felt off balance. He had an episode of nausea and vomiting.  He fell in to the bathtub but didn't hurt himself or hit his head. He got back to bed but every position he was in he was having severe dizziness.  He got up to go back to the bathroom and vomited and the bathtub.  He was finally able to get back to bed around 4 AM.  When he woke up this morning he felt much better.  Due to the severity of his symptoms multiple family members urged him to come to the ER to be checked out.  Currently he states that the back of his head "feels like Jell-O" but otherwise he feels back to baseline.  When he falls walks he does not feel off balance anymore but "feels hollow" and reports hearing his footsteps in his head.  He denies severe headache, loss of consciousness, fever, chills, sweats, body aches, chest pain, shortness of breath, cough, abdominal pain, current nausea, diarrhea.  Does not take any prescription medications.  A piece of plaster fell on his head 1 week ago and he did have some mild dizziness this past Wednesday but nothing like today.     HPI  Past Medical History:  Diagnosis Date  . Head injury   . No pertinent past medical history   . Post concussion syndrome     Patient Active Problem List   Diagnosis Date Noted  . Primary osteoarthritis of left knee 03/14/2019  . Effusion, left knee 03/14/2019  . DDD (degenerative disc disease), cervical 07/12/2016    Past  Surgical History:  Procedure Laterality Date  . KNEE SURGERY    . NO PAST SURGERIES          Home Medications    Prior to Admission medications   Medication Sig Start Date End Date Taking? Authorizing Provider  cyclobenzaprine (FLEXERIL) 10 MG tablet One half tab PO qHS, then increase gradually to one tab TID. 07/12/16   Monica Bectonhekkekandam, Thomas J, MD  Phenyleph-Doxylamine-DM-APAP (ALKA-SELTZER PLUS COLD & FLU) 10-12.5-20-650 MG PACK Take 1-2 tablets by mouth 2 (two) times daily as needed (for cold symptoms/preventative).    [provider]  predniSONE (DELTASONE) 50 MG tablet One tab PO daily for 5 days. 07/12/16   Monica Bectonhekkekandam, Thomas J, MD    Family History Family History  Problem Relation Age of Onset  . Hypertension Mother   . Hypertension Father   . Hypertension Sister     Social History Social History   Tobacco Use  . Smoking status: Current Every Day Smoker    Packs/day: 0.25    Types: Cigarettes  . Smokeless tobacco: Never Used  Substance Use Topics  . Alcohol use: No  . Drug use: No    Types: "Crack" cocaine     Allergies   Bee venom   Review of Systems Review of Systems  Constitutional:  Negative for chills, diaphoresis and fever.  Eyes: Negative for visual disturbance.  Respiratory: Negative for cough and shortness of breath.   Cardiovascular: Negative for chest pain.  Gastrointestinal: Positive for nausea and vomiting. Negative for abdominal pain and diarrhea.  Neurological: Positive for dizziness. Negative for syncope, weakness, light-headedness and headaches.  All other systems reviewed and are negative.    Physical Exam Updated Vital Signs BP (!) 165/99 (BP Location: Left Arm)   Pulse 87   Temp 99.4 F (37.4 C) (Oral)   Resp 18   Ht 6' (1.829 m)   Wt 102.1 kg   SpO2 100%   BMI 30.52 kg/m   Physical Exam Vitals signs and nursing note reviewed.  Constitutional:      General: He is not in acute distress.    Appearance: Normal  appearance. He is well-developed. He is not ill-appearing.  HENT:     Head: Normocephalic and atraumatic.  Eyes:     General: No scleral icterus.       Right eye: No discharge.        Left eye: No discharge.     Conjunctiva/sclera: Conjunctivae normal.     Pupils: Pupils are equal, round, and reactive to light.  Neck:     Musculoskeletal: Normal range of motion.  Cardiovascular:     Rate and Rhythm: Normal rate and regular rhythm.  Pulmonary:     Effort: Pulmonary effort is normal. No respiratory distress.     Breath sounds: Normal breath sounds.  Abdominal:     General: There is no distension.     Palpations: Abdomen is soft.     Tenderness: There is no abdominal tenderness.  Skin:    General: Skin is warm and dry.  Neurological:     Mental Status: He is alert and oriented to person, place, and time.     Comments: Mental Status:  Alert, oriented, thought content appropriate, able to give a coherent history. Speech fluent without evidence of aphasia. Able to follow 2 step commands without difficulty.  Cranial Nerves:  II:  Peripheral visual fields grossly normal, pupils equal, round, reactive to light III,IV, VI: ptosis not present, extra-ocular motions intact bilaterally  V,VII: smile symmetric, facial light touch sensation equal VIII: hearing grossly normal to voice  X: uvula elevates symmetrically  XI: bilateral shoulder shrug symmetric and strong XII: midline tongue extension without fassiculations Motor:  Normal tone. 5/5 in upper and lower extremities bilaterally including strong and equal grip strength and dorsiflexion/plantar flexion Cerebellar: normal finger-to-nose with bilateral upper extremities Gait: normal gait and balance CV: distal pulses palpable throughout    Psychiatric:        Behavior: Behavior normal.      ED Treatments / Results  Labs (all labs ordered are listed, but only abnormal results are displayed) Labs Reviewed  BASIC METABOLIC PANEL   CBC    EKG EKG Interpretation  Date/Time:  Saturday April 19 2019 14:03:29 EST Ventricular Rate:  68 PR Interval:    QRS Duration: 94 QT Interval:  387 QTC Calculation: 412 R Axis:   81 Text Interpretation: Sinus rhythm Ventricular premature complex Nonspecific repol abnormality, inferior leads Borderline ST elevation, lateral leads Confirmed by Vanetta Mulders [phone]) on 04/19/2019 2:18:31 PM   Radiology Ct Head Wo Contrast  Result Date: 04/19/2019 CLINICAL DATA:  Vertigo, dizziness with nausea and vomiting for 1 day. EXAM: CT HEAD WITHOUT CONTRAST TECHNIQUE: Contiguous axial images were obtained from the base of the skull through the vertex  without intravenous contrast. COMPARISON:  CT head dated 09/07/2007 FINDINGS: Brain: No evidence of acute infarction, hemorrhage, hydrocephalus, extra-axial collection or mass lesion/mass effect. Vascular: There are vascular calcifications in the carotid siphons. Skull: Normal. Negative for fracture or focal lesion. Sinuses/Orbits: Mild left maxillary sinus disease. Other: None. IMPRESSION: No acute intracranial process. Electronically Signed   By: Zerita Boers M.D.   On: 04/19/2019 15:02    Procedures Procedures (including critical care time)  Medications Ordered in ED Medications - No data to display   Initial Impression / Assessment and Plan / ED Course  I have reviewed the triage vital signs and the nursing notes.  Pertinent labs & imaging results that were available during my care of the patient were reviewed by me and considered in my medical decision making (see chart for details).  58 year old male presents with severe episodic dizziness starting this morning which is essentially resolved on my exam.  Blood pressure is elevated.  Otherwise vital signs are normal.  His exam is unremarkable.  He is able to walk without difficulty.  EKG is sinus rhythm with PVCs.  His labs are reassuring.  Will obtain CT of the head.  CT is  negative.  Discussed case with Dr. Darl Householder.  At this time since symptoms have resolved and he has a normal neurologic exam with reassuring imaging findings we will not pursue MRI.  He does not have any significant risk factors for stroke. Advised meclizine as needed and follow-up with neurology.  Advised return if worsening  Final Clinical Impressions(s) / ED Diagnoses   Final diagnoses:  Dizziness  Vertigo    ED Discharge Orders    None       Recardo Evangelist, PA-C 04/19/19 1634    Drenda Freeze, MD 04/20/19 [phone]

## 2019-04-19 NOTE — ED Notes (Signed)
Pt verbalized understanding of dc instructions.

## 2019-04-19 NOTE — Discharge Instructions (Addendum)
Take Meclizine 25mg  up to three times daily for dizziness Try the Epley maneuver for dizziness Please follow up with neurology

## 2019-04-19 NOTE — ED Notes (Signed)
ED Provider at bedside. 

## 2019-04-28 ENCOUNTER — Encounter: Payer: Self-pay | Admitting: Neurology

## 2019-04-28 ENCOUNTER — Other Ambulatory Visit: Payer: Self-pay

## 2019-04-28 ENCOUNTER — Ambulatory Visit: Payer: Medicare HMO | Admitting: Neurology

## 2019-04-28 VITALS — BP 137/86 | HR 80 | Temp 97.7°F | Ht 72.0 in | Wt 240.5 lb

## 2019-04-28 DIAGNOSIS — H8111 Benign paroxysmal vertigo, right ear: Secondary | ICD-10-CM | POA: Diagnosis not present
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
## 2019-05-03 ENCOUNTER — Other Ambulatory Visit: Payer: Self-pay

## 2019-05-03 DIAGNOSIS — Z20822 Contact with and (suspected) exposure to covid-19: Secondary | ICD-10-CM

## 2019-05-06 LAB — NOVEL CORONAVIRUS, NAA: SARS-CoV-2, NAA: NOT DETECTED

## 2019-05-21 ENCOUNTER — Ambulatory Visit: Payer: Medicare HMO | Attending: Internal Medicine

## 2019-05-21 ENCOUNTER — Other Ambulatory Visit: Payer: Medicare HMO

## 2019-05-21 DIAGNOSIS — Z20828 Contact with and (suspected) exposure to other viral communicable diseases: Secondary | ICD-10-CM | POA: Diagnosis not present

## 2019-05-21 DIAGNOSIS — Z20822 Contact with and (suspected) exposure to covid-19: Secondary | ICD-10-CM

## 2019-05-23 LAB — NOVEL CORONAVIRUS, NAA: SARS-CoV-2, NAA: NOT DETECTED

## 2019-05-26 ENCOUNTER — Ambulatory Visit: Payer: Medicare HMO | Admitting: Neurology
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
## 2019-06-10 ENCOUNTER — Telehealth: Payer: Self-pay | Admitting: Neurology

## 2019-06-10 NOTE — Telephone Encounter (Signed)
Pt called stating that he was seen for Vertigo and had a follow up visit but canceled it due to feeling better. Pt states now that he had a case of vertigo again this morning and is wanting to be advised.

## 2019-06-10 NOTE — Telephone Encounter (Signed)
The patient is requesting revisit for vertigo. He has tried the Epley maneuver at home and it has not been helpful.  He has been scheduled to see Margie Ege, NP on 06/12/2019.
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
## 2019-06-12 ENCOUNTER — Encounter: Payer: Self-pay | Admitting: Neurology

## 2019-06-12 ENCOUNTER — Ambulatory Visit: Payer: Medicare HMO | Admitting: Neurology

## 2019-06-12 ENCOUNTER — Other Ambulatory Visit: Payer: Self-pay

## 2019-06-12 VITALS — BP 154/95 | HR 78 | Temp 98.6°F | Ht 72.0 in | Wt 243.4 lb

## 2019-06-12 DIAGNOSIS — H8111 Benign paroxysmal vertigo, right ear: Secondary | ICD-10-CM | POA: Diagnosis not present

## 2019-06-12 NOTE — Patient Instructions (Signed)
I will order MRI of the brain, I will send you to vestibular rehab for vertigo

## 2019-06-18 ENCOUNTER — Telehealth: Payer: Self-pay | Admitting: Neurology

## 2019-06-18 NOTE — Telephone Encounter (Signed)
Patient sent to GI via email with notation that the authorization is pending but has been submitted. DWD

## 2019-06-18 NOTE — Telephone Encounter (Signed)
06/18/19 Insurance gave me a tracking number for approval [id number] . Insurance will fax [place] a hard copy with approval number to [phone] . Then patient can be sent to [facility] imaging. Arma Heading

## 2019-06-23 ENCOUNTER — Other Ambulatory Visit: Payer: Medicare HMO

## 2019-06-30 ENCOUNTER — Ambulatory Visit
Admission: RE | Admit: 2019-06-30 | Discharge: 2019-06-30 | Disposition: A | Discharge status: Home / Self Care | Payer: Medicare HMO | Source: Ambulatory Visit | Attending: Neurology | Admitting: Neurology

## 2019-06-30 DIAGNOSIS — R42 Dizziness and giddiness: Secondary | ICD-10-CM | POA: Diagnosis not present

## 2019-06-30 DIAGNOSIS — R519 Headache, unspecified: Secondary | ICD-10-CM | POA: Diagnosis not present

## 2019-07-01 ENCOUNTER — Telehealth: Payer: Self-pay

## 2019-07-01 DIAGNOSIS — Z0000 Encounter for general adult medical examination without abnormal findings: Secondary | ICD-10-CM | POA: Diagnosis not present

## 2019-07-01 DIAGNOSIS — Z1389 Encounter for screening for other disorder: Secondary | ICD-10-CM | POA: Diagnosis not present

## 2019-07-01 DIAGNOSIS — R42 Dizziness and giddiness: Secondary | ICD-10-CM | POA: Diagnosis not present

## 2019-07-01 DIAGNOSIS — Z125 Encounter for screening for malignant neoplasm of prostate: Secondary | ICD-10-CM | POA: Diagnosis not present

## 2019-07-01 DIAGNOSIS — E785 Hyperlipidemia, unspecified: Secondary | ICD-10-CM | POA: Diagnosis not present

## 2019-07-01 DIAGNOSIS — Z87820 Personal history of traumatic brain injury: Secondary | ICD-10-CM | POA: Diagnosis not present

## 2019-07-01 DIAGNOSIS — F17210 Nicotine dependence, cigarettes, uncomplicated: Secondary | ICD-10-CM | POA: Diagnosis not present

## 2019-07-01 NOTE — Progress Notes (Signed)
I have reviewed and agreed above plan. 

## 2019-07-01 NOTE — Telephone Encounter (Signed)
Called and spoke with the pt directly. Relayed the results of his recent MRI per NP Margie Ege. Pt demonstrated understanding and had no questions.   "Please call the patient. MRI of the brain was normal.   IMPRESSION:  Normal examination. No abnormality seen to explain the presenting  symptoms. No finding attributable to distant head trauma."-NP SS

## 2019-07-03 ENCOUNTER — Other Ambulatory Visit: Payer: Self-pay

## 2019-07-03 ENCOUNTER — Ambulatory Visit: Payer: Medicare HMO | Admitting: Physical Therapy

## 2019-07-10 ENCOUNTER — Encounter: Payer: Self-pay | Admitting: Physical Therapy

## 2019-07-10 ENCOUNTER — Other Ambulatory Visit: Payer: Self-pay

## 2019-07-10 ENCOUNTER — Ambulatory Visit: Payer: Medicare HMO | Attending: Neurology | Admitting: Physical Therapy

## 2019-07-10 DIAGNOSIS — H8112 Benign paroxysmal vertigo, left ear: Secondary | ICD-10-CM | POA: Insufficient documentation

## 2019-07-10 DIAGNOSIS — R42 Dizziness and giddiness: Secondary | ICD-10-CM | POA: Diagnosis not present

## 2019-07-10 NOTE — Therapy (Addendum)
[facility] [address] [place], [place], [address] Phone: [phone]   Fax:  [phone]  Physical Therapy Evaluation  Patient Details  Name: Keith Mcdonald MRN: [id number] Date of Birth: [birth year] Referring Provider (PT): Suzzanne Cloud, NP   Encounter Date: 07/10/2019  PT End of Session - 07/10/19 1429    Visit Number  1    Number of Visits  4    Date for PT Re-Evaluation  08/09/19    Authorization Type  [employer], $40 copay    PT Start Time  0800    PT Stop Time  0850    PT Time Calculation (min)  50 min    Activity Tolerance  Patient tolerated treatment well    Behavior During Therapy  [facility] for tasks assessed/performed;Anxious       Past Medical History:  Diagnosis Date  . Head injury    04/28/2019 -- 1st one - 15 yrs ago//2nd one - 12 yrs ago  . Post concussion syndrome     Past Surgical History:  Procedure Laterality Date  . KNEE SURGERY      There were no vitals filed for this visit.   Subjective Assessment - 07/10/19 0809    Subjective  Prior to November pt had never experienced any vertigo - in November pt was fixing a ceiling tile and the plastic fell and hit him in the head.  No LOC - kept working.  A few days later pt woke up with severe dizziness - world was spinning.  Occurred again 2 weeks later - not as severe but had severe migraine HA for 2 weeks.  Went to ED and had imaging done but all was WNL.    Pertinent History  closed head injury x 2, post concussion syndrome and knee surgery    Diagnostic tests  CT and MRI    Patient Stated Goals  To get rid of the dizziness    Currently in Pain?  No/denies         [facility] PT Assessment - 07/10/19 6962      Assessment   Medical Diagnosis  Vertigo    Referring Provider (PT)  Suzzanne Cloud, NP    Onset Date/Surgical Date  06/12/19      Precautions   Precautions  Other (comment)    Precaution Comments  closed head injury x 2, post concussion  syndrome and knee surgery      Balance Screen   Has the patient fallen in the past 6 months  Yes    How many times?  1 -into tub when dizzy      [place] residence    Living Arrangements  Spouse/significant other    Type of [place]    Additional Comments  Has been renovating an older home      Prior Function   Level of [place]  Part time employment    Vocation Requirements  household renovations, pour concrete, Games developer, landscaping      Observation/Other Assessments   Focus on Therapeutic Outcomes (FOTO)   Functional status: 73%, 27% limitation    Other Surveys   Dizziness Handicap Inventory (DHI)    Dizziness Handicap Inventory [facility])   48      Sensation   Light Touch  Appears Intact           Vestibular Assessment - 07/10/19 [phone]  Vestibular Assessment   General Observation  Pt very anxious about dizziness - concerned he has a brain tumor or is going to die.      Symptom Behavior   Subjective history of current problem  Denies changes in vision or hearing except for when he had the HA; denies tinnitus or aural fullness    Type of Dizziness   Spinning    Frequency of Dizziness  2 episodes    Duration of Dizziness  5-6 hours - 24 hours    Symptom Nature  Spontaneous    Aggravating Factors  Spontaneous onset;Supine to sit;Looking up to the ceiling;Forward bending    Relieving Factors  Rest    Progression of Symptoms  Better    History of similar episodes  Pt experienced dizziness with concussions but not       Oculomotor Exam   Oculomotor Alignment  Normal    Ocular ROM  WFL    Spontaneous  Absent    Gaze-induced   Absent    Smooth Pursuits  Intact    Saccades  Intact    Comment  Convergence intact      Oculomotor Exam-Fixation Suppressed    Left Head Impulse  positive    Right Head Impulse  negative      Vestibulo-Ocular Reflex   VOR to Slow Head Movement  Normal    VOR  Cancellation  Normal      Positional Testing   Dix-Hallpike  Dix-Hallpike Right;Dix-Hallpike Left    Horizontal Canal Testing  Horizontal Canal Right;Horizontal Canal Left      Dix-Hallpike Right   Dix-Hallpike Right Duration  0    Dix-Hallpike Right Symptoms  No nystagmus      Dix-Hallpike Left   Dix-Hallpike Left Duration  10 seconds    Dix-Hallpike Left Symptoms  Other (comment)   no nystagums but pt reports vertigo     Horizontal Canal Right   Horizontal Canal Right Duration  0    Horizontal Canal Right Symptoms  Normal      Horizontal Canal Left   Horizontal Canal Left Duration  0    Horizontal Canal Left Symptoms  Normal          Objective measurements completed on examination: See above findings.       Vestibular Treatment/Exercise - 07/10/19 0848      Vestibular Treatment/Exercise   Vestibular Treatment Provided  Canalith Repositioning    Canalith Repositioning  Epley Manuever Left       EPLEY MANUEVER LEFT   Number of Reps   2    Overall Response   Improved Symptoms            PT Education - 07/10/19 1428    Education Details  clinical findings, PT POC, goals, BPPV    Person(s) Educated  Patient;Spouse    Methods  Explanation    Comprehension  Verbalized understanding          PT Long Term Goals - 07/10/19 1433      PT LONG TERM GOAL #1   Title  Pt will demonstrate negative positional testing    Baseline  L posterior canal BPPV    Time  4    Period  Weeks    Status  New    Target Date  08/09/19      PT LONG TERM GOAL #2   Title  Pt will report no dizziness when looking up at the ceiling and bending down to the floor when working  Time  4    Period  Weeks    Status  New    Target Date  08/09/19      PT LONG TERM GOAL #3   Title  Pt will demonstrate independence with vestibular HEP    Time  4    Period  Weeks    Status  New    Target Date  08/09/19      PT LONG TERM GOAL #4   Title  Pt will improve overall function on  FOTO to >/= 85% and will decrease DHI by 18 points    Baseline  DHI 48 - moderate    Time  4    Period  Weeks    Status  New    Target Date  08/09/19             Plan - 07/10/19 1430    Clinical Impression Statement  Pt is a 59 year old male referred to Neuro OPPT for evaluation of vertigo.  Pt's PMH is significant for the following: closed head injury x 2, post concussion syndrome and knee surgery. The following deficits were noted during pt's exam: impaired VOR with + L head impulse test and pt reports dizziness/vertigo during L hallpike-dix of short duration but no nystagmus noted - likely due to fixation and suppression of nystagmus.  Pt's symptoms were consistent with L posterior canal BPPV and pt responded well to CRM but pt may also have component of vestibular hypofunction placing pt at risk for falls.  Pt would benefit from skilled PT to address these impairments and functional limitations to maximize functional mobility independence and reduce falls risk.    Personal Factors and Comorbidities  Comorbidity 2;Profession    Comorbidities  closed head injury x 2, post concussion syndrome and knee surgery    Examination-Activity Limitations  Bend;Reach Overhead;Bed Mobility    Examination-Participation Restrictions  Yard Work    Stability/Clinical Decision Making  Stable/Uncomplicated    Clinical Decision Making  Low    Rehab Potential  Good    PT Frequency  1x / week    PT Duration  4 weeks    PT Treatment/Interventions  ADLs/Self Care Home Management;Canalith Repostioning;Therapeutic activities;Functional mobility training;Therapeutic exercise;Balance training;Neuromuscular re-education;Patient/family education;Vestibular    PT Next Visit Plan  Recheck for L posterior canal BPPV, treat as indicated.  Initiate VOR training.  Check FOTO score and add goal    Consulted and Agree with Plan of Care  Patient       Patient will benefit from skilled therapeutic intervention in order to  improve the following deficits and impairments:  Dizziness  Visit Diagnosis: BPPV (benign paroxysmal positional vertigo), left  Dizziness and giddiness     Problem List Patient Active Problem List   Diagnosis Date Noted  . Benign paroxysmal positional vertigo of right ear 04/28/2019  . Primary osteoarthritis of left knee 03/14/2019  . Effusion, left knee 03/14/2019  . DDD (degenerative disc disease), cervical 07/12/2016    Dierdre Highman, PT, DPT 07/10/19    2:37 PM    [facility] [address] [place], [place], [address] Phone: [phone]   Fax:  [phone]  Name: ECTOR LAUREL MRN: [id number] Date of Birth: 13-Jun-1960

## 2019-07-16 ENCOUNTER — Ambulatory Visit: Payer: Medicare HMO | Admitting: Physical Therapy

## 2019-07-16 ENCOUNTER — Other Ambulatory Visit: Payer: Self-pay

## 2019-07-16 ENCOUNTER — Encounter: Payer: Self-pay | Admitting: Physical Therapy

## 2019-07-16 DIAGNOSIS — H8112 Benign paroxysmal vertigo, left ear: Secondary | ICD-10-CM

## 2019-07-16 DIAGNOSIS — R42 Dizziness and giddiness: Secondary | ICD-10-CM | POA: Diagnosis not present

## 2019-07-16 NOTE — Therapy (Signed)
[facility] [address] [place], [place], [address] Phone: [phone]   Fax:  [phone]  Physical Therapy Treatment  Patient Details  Name: Keith Mcdonald MRN: [id number] Date of Birth: [birth year] Referring Provider (PT): Glean Salvo, NP   Encounter Date: 07/16/2019  PT End of Session - 07/16/19 1152    Visit Number  2    Number of Visits  4    Date for PT Re-Evaluation  08/09/19    Authorization Type  [employer], $40 copay    PT Start Time  0805    PT Stop Time  0850    PT Time Calculation (min)  45 min    Activity Tolerance  Patient tolerated treatment well    Behavior During Therapy  [facility] for tasks assessed/performed       Past Medical History:  Diagnosis Date  . Head injury    04/28/2019 -- 1st one - 15 yrs ago//2nd one - 12 yrs ago  . Post concussion syndrome     Past Surgical History:  Procedure Laterality Date  . KNEE SURGERY      There were no vitals filed for this visit.  Subjective Assessment - 07/16/19 0807    Subjective  Dizziness is much better, this morning when looking up in the shower had slight dizziness but nothing like it was.    Pertinent History  closed head injury x 2, post concussion syndrome and knee surgery    Diagnostic tests  CT and MRI    Patient Stated Goals  To get rid of the dizziness    Currently in Pain?  No/denies             Vestibular Assessment - 07/16/19 0827      Positional Testing   Dix-Hallpike  Dix-Hallpike Left      Dix-Hallpike Left   Dix-Hallpike Left Duration  5 seconds    Dix-Hallpike Left Symptoms  No nystagmus   modified with pillow to teach wife manuever, 2 sets               Vestibular Treatment/Exercise - 07/16/19 0829      Vestibular Treatment/Exercise   Vestibular Treatment Provided  Canalith Repositioning;Gaze    Canalith Repositioning  Epley Manuever Left    Gaze Exercises  X1 Viewing Horizontal;X1 Viewing Vertical        EPLEY MANUEVER LEFT   Number of Reps   2    Overall Response   Symptoms Resolved     RESPONSE DETAILS LEFT  modified Epley with pillow in order to teach wife home maneuver      X1 Viewing Horizontal   Foot Position  standing feet apart    Reps  1    Comments  30 seconds, mild symptoms      X1 Viewing Vertical   Foot Position  standing feet apart    Reps  1    Comments  30 seconds, no symptoms            PT Education - 07/16/19 1152    Education Details  home CRM modified with pillow and x1 viewing    Person(s) Educated  Patient;Spouse    Methods  Explanation;Demonstration    Comprehension  Verbalized understanding;Returned demonstration          PT Long Term Goals - 07/10/19 1433      PT LONG TERM GOAL #1   Title  Pt will demonstrate negative positional testing    Baseline  L posterior canal BPPV    Time  4    Period  Weeks    Status  New    Target Date  08/09/19      PT LONG TERM GOAL #2   Title  Pt will report no dizziness when looking up at the ceiling and bending down to the floor when working    Time  4    Period  Weeks    Status  New    Target Date  08/09/19      PT LONG TERM GOAL #3   Title  Pt will demonstrate independence with vestibular HEP    Time  4    Period  Weeks    Status  New    Target Date  08/09/19      PT LONG TERM GOAL #4   Title  Pt will improve overall function on FOTO to >/= 85% and will decrease DHI by 18 points    Baseline  DHI 48 - moderate    Time  4    Period  Weeks    Status  New    Target Date  08/09/19            Plan - 07/16/19 1153    Clinical Impression Statement  Treatment session focused on reassessment of peripheral canals and teaching patient and wife how to perform modified CRM for home.  Symptoms resolved after first CRM but performed a second one to allow wife to return demonstrate technique.  Initiated x1 viewing in standing to address vestibular hypofunction; pt demonstrated mild symptoms.   Continued to educate pt and wife on reoccurence of BPPV and indications for seeking further medical attention or when to return to therapy for further assessment.  If symptoms continue to improve will likely D/C at next visit.    Personal Factors and Comorbidities  Comorbidity 2;Profession    Comorbidities  closed head injury x 2, post concussion syndrome and knee surgery    Examination-Activity Limitations  Bend;Reach Overhead;Bed Mobility    Examination-Participation Restrictions  Yard Work    Stability/Clinical Decision Making  Stable/Uncomplicated    Rehab Potential  Good    PT Frequency  1x / week    PT Duration  4 weeks    PT Treatment/Interventions  ADLs/Self Care Home Management;Canalith Repostioning;Therapeutic activities;Functional mobility training;Therapeutic exercise;Balance training;Neuromuscular re-education;Patient/family education;Vestibular    PT Next Visit Plan  Recheck for L posterior canal BPPV, treat as indicated.  Check goals, FOTO, update HEP, D/C    Consulted and Agree with Plan of Care  Patient;Family member/caregiver    Family Member Consulted  wife       Patient will benefit from skilled therapeutic intervention in order to improve the following deficits and impairments:  Dizziness  Visit Diagnosis: Dizziness and giddiness  BPPV (benign paroxysmal positional vertigo), left     Problem List Patient Active Problem List   Diagnosis Date Noted  . Benign paroxysmal positional vertigo of right ear 04/28/2019  . Primary osteoarthritis of left knee 03/14/2019  . Effusion, left knee 03/14/2019  . DDD (degenerative disc disease), cervical 07/12/2016    Rico Junker, PT, DPT 07/16/19    12:00 PM    [facility] [address] [place], [place], [address] Phone: [phone]   Fax:  [phone]  Name: Keith Mcdonald MRN: [id number] Date of Birth: 1961/03/10

## 2019-07-16 NOTE — Patient Instructions (Signed)
How to Perform the Epley Maneuver The Epley maneuver is an exercise that relieves symptoms of vertigo. Vertigo is the feeling that you or your surroundings are moving when they are not. When you feel vertigo, you may feel like the room is spinning and have trouble walking. Dizziness is a little different than vertigo. When you are dizzy, you may feel unsteady or light-headed. You can do this maneuver at home whenever you have symptoms of vertigo. You can do it up to 3 times a day until your symptoms go away. Even though the Epley maneuver may relieve your vertigo for a few weeks, it is possible that your symptoms will return. This maneuver relieves vertigo, but it does not relieve dizziness. What are the risks? If it is done correctly, the Epley maneuver is considered safe. Sometimes it can lead to dizziness or nausea that goes away after a short time. If you develop other symptoms, such as changes in vision, weakness, or numbness, stop doing the maneuver and call your health care provider. How to perform the Epley maneuver 1. Sit on the edge of a bed or table with your back straight and your legs extended or hanging over the edge of the bed or table. 2. Turn your head halfway toward the affected ear or side. 3. Lie backward quickly with your head turned until you are lying flat on your back. You may want to position a pillow under your shoulders. 4. Hold this position for 30 seconds. You may experience an attack of vertigo. This is normal. 5. Turn your head to the opposite direction until your unaffected ear is facing the floor. 6. Hold this position for 30 seconds. You may experience an attack of vertigo. This is normal. Hold this position until the vertigo stops. 7. Turn your whole body to the same side as your head. Hold for another 30 seconds. 8. Sit back up. You can repeat this exercise up to 3 times a day. Follow these instructions at home:  After doing the Epley maneuver, you can return to  your normal activities.  Ask your health care provider if there is anything you should do at home to prevent vertigo. He or she may recommend that you: ? Keep your head raised (elevated) with two or more pillows while you sleep. ? Do not sleep on the side of your affected ear. ? Get up slowly from bed. ? Avoid sudden movements during the day. ? Avoid extreme head movement, like looking up or bending over. Contact a health care provider if:  Your vertigo gets worse.  You have other symptoms, including: ? Nausea. ? Vomiting. ? Headache. Get help right away if:  You have vision changes.  You have a severe or worsening headache or neck pain.  You cannot stop vomiting.  You have new numbness or weakness in any part of your body. Summary  Vertigo is the feeling that you or your surroundings are moving when they are not.  The Epley maneuver is an exercise that relieves symptoms of vertigo.  If the Epley maneuver is done correctly, it is considered safe. You can do it up to 3 times a day. This information is not intended to replace advice given to you by your health care provider. Make sure you discuss any questions you have with your health care provider. Document Released: 05/20/2013 Document Revised: 04/04/2016 Document Reviewed: 04/04/2016 Elsevier Interactive Patient Education  2017 [employer].       Gaze Stabilization - Tip Card  1.Target must remain in focus, not blurry, and appear stationary while head is in motion. 2.Perform exercises with small head movements (45 to either side of midline). 3.Increase speed of head motion so long as target is in focus. 4.If you wear eyeglasses, be sure you can see target through lens (therapist will give specific instructions for bifocal / progressive lenses). 5.These exercises may provoke dizziness or nausea. Work through these symptoms. If too dizzy, slow head movement slightly. Rest between each exercise. 6.Exercises demand  concentration; avoid distractions. 7.For safety, perform standing exercises close to a counter, wall, corner, or next to someone.  Copyright  VHI. All rights reserved.   Gaze Stabilization - Standing Feet Apart   Feet shoulder width apart, keeping eyes on target on wall 3 feet away, tilt head down slightly and move head side to side for 30 seconds. Repeat while moving head up and down for 30 seconds. *Work up to tolerating 60 seconds, as able. Do 2-3 sessions per day.

## 2019-07-23 ENCOUNTER — Ambulatory Visit: Payer: Medicare HMO | Admitting: Physical Therapy

## 2019-07-23 ENCOUNTER — Other Ambulatory Visit: Payer: Self-pay

## 2019-07-23 ENCOUNTER — Encounter: Payer: Self-pay | Admitting: Physical Therapy

## 2019-07-23 DIAGNOSIS — R42 Dizziness and giddiness: Secondary | ICD-10-CM

## 2019-07-23 DIAGNOSIS — H8112 Benign paroxysmal vertigo, left ear: Secondary | ICD-10-CM | POA: Diagnosis not present

## 2019-07-23 NOTE — Therapy (Signed)
[facility] [address] [place], [place], [address] Phone: [phone]   Fax:  [phone]  Physical Therapy Treatment and D/C Summary  Patient Details  Name: Keith Mcdonald MRN: [id number] Date of Birth: [birth year] Referring Provider (PT): Suzzanne Cloud, NP   Encounter Date: 07/23/2019  PT End of Session - 07/23/19 1121    Visit Number  3    Number of Visits  4    Date for PT Re-Evaluation  08/09/19    Authorization Type  [employer], $40 copay    PT Start Time  0800    PT Stop Time  0845    PT Time Calculation (min)  45 min    Activity Tolerance  Patient tolerated treatment well    Behavior During Therapy  [facility] for tasks assessed/performed       Past Medical History:  Diagnosis Date  . Head injury    04/28/2019 -- 1st one - 15 yrs ago//2nd one - 12 yrs ago  . Post concussion syndrome     Past Surgical History:  Procedure Laterality Date  . KNEE SURGERY      There were no vitals filed for this visit.  Subjective Assessment - 07/23/19 0808    Subjective  Dizziness is better but had a slight headache the other day.  Has been working hard.  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    Pertinent History  closed head injury x 2, post concussion syndrome and knee surgery    Diagnostic tests  CT and MRI    Patient Stated Goals  To get rid of the dizziness    Currently in Pain?  No/denies         [facility] PT Assessment - 07/23/19 1121      Observation/Other Assessments   Focus on Therapeutic Outcomes (FOTO)   97% functional level    Other Surveys   Dizziness Handicap Inventory [facility])    Dizziness Handicap Inventory [facility])   18                    Vestibular Treatment/Exercise - 07/23/19 0819      Vestibular Treatment/Exercise   Vestibular Treatment Provided  Habituation;Gaze    Habituation Exercises  Standing Horizontal Head Turns;Standing Vertical Head Turns;Standing Diagonal Head Turns    Gaze Exercises  X1  Viewing Horizontal;X1 Viewing Vertical      Standing Horizontal Head Turns   Number of Reps   4    Symptom Description   moving cones from floor <> tall cabinet and then moving stick notes on Balance Master with busy background      Standing Vertical Head Turns   Number of Reps   4    Symptom Description   moving cones from floor <> tall target and then in Balance Master with busy background moving targets between floor and front wall      Standing Diagonal Head Turns   Number of Reps   4    Symptiom Description   moving cones from floor <> tall cabinet and then moving stick notes on Balance Master with busy background      X1 Viewing Horizontal   Foot Position  feet together solid surface, feet apart compliant surface    Comments  60 seconds each, mild symptoms      X1 Viewing Vertical   Foot Position  feet together solid surface and compliant surface    Comments  60 seconds each - no symptoms  PT Education - 07/23/19 1121    Education Details  final HEP - indications for returning to therapy if vertigo returns when returning to work    [employer]) Educated  Patient;Spouse    Methods  Explanation;Demonstration;Handout    Comprehension  Verbalized understanding;Returned demonstration          PT Long Term Goals - 07/23/19 0809      PT LONG TERM GOAL #1   Title  Pt will demonstrate negative positional testing    Baseline  L posterior canal BPPV    Time  4    Period  Weeks    Status  Achieved      PT LONG TERM GOAL #2   Title  Pt will report no dizziness when looking up at the ceiling and bending down to the floor when working    Time  4    Period  Weeks    Status  Achieved      PT LONG TERM GOAL #3   Title  Pt will demonstrate independence with vestibular HEP    Time  4    Period  Weeks    Status  Achieved      PT LONG TERM GOAL #4   Title  Pt will improve overall function on FOTO to >/= 85% and will decrease DHI by 18 points    Baseline  --     Time  4    Period  Weeks    Status  Achieved            Plan - 07/23/19 1122    Clinical Impression Statement  Pt has made excellent progress and has met all LTG.  Pt demonstrates complete resolution of positional vertigo and does not demonstrate any motion sensitivity to looking up at the ceiling or bending down to the floor when performed repeatedly today.  Pt is independent with HEP and is planning on returning to full time work.  Pt is safe for D/C at this time.    Personal Factors and Comorbidities  Comorbidity 2;Profession    Comorbidities  closed head injury x 2, post concussion syndrome and knee surgery    Examination-Activity Limitations  Bend;Reach Overhead;Bed Mobility    Examination-Participation Restrictions  Yard Work    Stability/Clinical Decision Making  Stable/Uncomplicated    Rehab Potential  Good    PT Frequency  1x / week    PT Duration  4 weeks    PT Treatment/Interventions  ADLs/Self Care Home Management;Canalith Repostioning;Therapeutic activities;Functional mobility training;Therapeutic exercise;Balance training;Neuromuscular re-education;Patient/family education;Vestibular    Consulted and Agree with Plan of Care  Patient;Family member/caregiver    Family Member Consulted  wife       Patient will benefit from skilled therapeutic intervention in order to improve the following deficits and impairments:  Dizziness  Visit Diagnosis: Dizziness and giddiness  BPPV (benign paroxysmal positional vertigo), left     Problem List Patient Active Problem List   Diagnosis Date Noted  . Benign paroxysmal positional vertigo of right ear 04/28/2019  . Primary osteoarthritis of left knee 03/14/2019  . Effusion, left knee 03/14/2019  . DDD (degenerative disc disease), cervical 07/12/2016    PHYSICAL THERAPY DISCHARGE SUMMARY  Visits from Start of Care: 3  Current functional level related to goals / functional outcomes: See LTG achievement and impression  statement above.   Remaining deficits: Very mild dizziness when transitioning supine > sit quickly; waits for a brief period of time to allow symptoms to settle.  Education / Equipment: HEP  Plan: Patient agrees to discharge.  Patient goals were met. Patient is being discharged due to meeting the stated rehab goals.  ?????     Rico Junker, PT, DPT 07/23/19    11:25 AM    [facility] [address] [place], [place], [address] Phone: [phone]   Fax:  [phone]  Name: Keith Mcdonald MRN: [id number] Date of Birth: 03-Jan-1961

## 2019-07-23 NOTE — Patient Instructions (Addendum)
Gaze Stabilization: Standing Feet Together (Compliant Surface)    Feet together on pillow, keeping eyes on target on wall __3__ feet away, tilt head down 15-30 and move head side to side for _60_ seconds. Repeat while moving head up and down for _60_ seconds. Do _1-2___ sessions per day.

## 2019-09-22 DIAGNOSIS — M79605 Pain in left leg: Secondary | ICD-10-CM | POA: Diagnosis not present

## 2019-09-22 DIAGNOSIS — M25572 Pain in left ankle and joints of left foot: Secondary | ICD-10-CM | POA: Diagnosis not present

## 2019-10-13 ENCOUNTER — Other Ambulatory Visit: Payer: Self-pay

## 2019-10-13 ENCOUNTER — Encounter: Payer: Self-pay | Admitting: Neurology

## 2019-10-13 ENCOUNTER — Ambulatory Visit: Payer: Medicare HMO | Admitting: Neurology

## 2019-10-13 VITALS — BP 152/97 | HR 67 | Ht 72.0 in | Wt 254.0 lb

## 2019-10-13 DIAGNOSIS — R519 Headache, unspecified: Secondary | ICD-10-CM | POA: Diagnosis not present

## 2019-10-13 DIAGNOSIS — H8111 Benign paroxysmal vertigo, right ear: Secondary | ICD-10-CM | POA: Diagnosis not present
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
## 2019-10-21 ENCOUNTER — Encounter: Payer: Self-pay | Admitting: Neurology

## 2019-12-11 DIAGNOSIS — J069 Acute upper respiratory infection, unspecified: Secondary | ICD-10-CM | POA: Diagnosis not present

## 2020-01-31 ENCOUNTER — Other Ambulatory Visit: Payer: Self-pay

## 2020-01-31 ENCOUNTER — Encounter (HOSPITAL_COMMUNITY): Payer: Self-pay

## 2020-01-31 ENCOUNTER — Emergency Department (HOSPITAL_COMMUNITY)
Admission: EM | Admit: 2020-01-31 | Discharge: 2020-01-31 | Disposition: A | Discharge status: Home / Self Care | Payer: Medicare HMO | Attending: Emergency Medicine | Admitting: Emergency Medicine

## 2020-01-31 DIAGNOSIS — Z5321 Procedure and treatment not carried out due to patient leaving prior to being seen by health care provider: Secondary | ICD-10-CM | POA: Diagnosis not present

## 2020-01-31 DIAGNOSIS — H5710 Ocular pain, unspecified eye: Secondary | ICD-10-CM | POA: Insufficient documentation

## 2020-01-31 HISTORY — DX: Dizziness and giddiness: R42
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
## 2020-02-01 ENCOUNTER — Emergency Department (HOSPITAL_BASED_OUTPATIENT_CLINIC_OR_DEPARTMENT_OTHER)
Admission: EM | Admit: 2020-02-01 | Discharge: 2020-02-01 | Disposition: A | Discharge status: Home / Self Care | Payer: Medicare HMO | Attending: Emergency Medicine | Admitting: Emergency Medicine

## 2020-02-01 ENCOUNTER — Encounter (HOSPITAL_BASED_OUTPATIENT_CLINIC_OR_DEPARTMENT_OTHER): Payer: Self-pay | Admitting: *Deleted

## 2020-02-01 ENCOUNTER — Other Ambulatory Visit: Payer: Self-pay

## 2020-02-01 DIAGNOSIS — H52531 Spasm of accommodation, right eye: Secondary | ICD-10-CM | POA: Insufficient documentation

## 2020-02-01 DIAGNOSIS — F17210 Nicotine dependence, cigarettes, uncomplicated: Secondary | ICD-10-CM | POA: Insufficient documentation

## 2020-02-01 MED ORDER — CYCLOPENTOLATE HCL 1 % OP SOLN
2.0000 [drp] | Freq: Two times a day (BID) | OPHTHALMIC | Status: DC
  Administered 2020-02-01: 2 [drp] via OPHTHALMIC
  Filled 2020-02-01: qty 2

## 2020-02-01 MED ORDER — FLUORESCEIN SODIUM 1 MG OP STRP
ORAL_STRIP | OPHTHALMIC | Status: AC
  Filled 2020-02-01: qty 1

## 2020-02-01 MED ORDER — TETRACAINE HCL 0.5 % OP SOLN
OPHTHALMIC | Status: AC
  Filled 2020-02-01: qty 4

## 2020-02-01 NOTE — ED Provider Notes (Signed)
[facility] Provider Note: [name], MD, FACEP  CSN: [id number] MRN: [id number] ARRIVAL: 02/01/20 at 0427 ROOM: MH06/[facility]06   CHIEF COMPLAINT  Eye Injury   HISTORY OF PRESENT ILLNESS  02/01/20 5:19 AM Keith Mcdonald is a 59 y.o. male who was doing yard work the past several days which included grading the land and burning wood.  Yesterday afternoon he started having right eye pain.  His right eye is swollen and he has a foreign body sensation which he rates as an 8 out of 10.  Vision is a little blurry intermittently.  He has photophobia in the right eye.  He has attempted to flush his eye with water and with saline without relief.    Past Medical History:  Diagnosis Date  . Head injury    04/28/2019 -- 1st one - 15 yrs ago//2nd one - 12 yrs ago  . Post concussion syndrome   . Vertigo     Past Surgical History:  Procedure Laterality Date  . KNEE SURGERY      Family History  Problem Relation Age of Onset  . Hypertension Mother   . Hypertension Father   . Hypertension Sister     Social History   Tobacco Use  . Smoking status: Current Every Day Smoker    Packs/day: 0.25    Types: Cigarettes  . Smokeless tobacco: Never Used  Vaping Use  . Vaping Use: Never used  Substance Use Topics  . Alcohol use: No  . Drug use: No    Types: "Crack" cocaine    Prior to Admission medications   Not on File    Allergies Bee venom   REVIEW OF SYSTEMS  Negative except as noted here or in the History of Present Illness.   PHYSICAL EXAMINATION  Initial Vital Signs Blood pressure (!) 148/89, pulse 85, temperature 98.6 F (37 C), resp. rate 18, height 6' (1.829 m), weight 108.9 kg.  Examination General: Well-developed, well-nourished male in no acute distress; appearance consistent with age of record HENT: normocephalic; atraumatic Eyes: Extraocular muscles intact; mild right conjunctival injection and edema; left pupil round and reactive to light, right  pupil constricted and fixed with photophobia; no abrasion seen on fluorescein exam Neck: supple  Heart: regular rate and rhythm Lungs: clear to auscultation bilaterally Abdomen: soft; nondistended; nontender; bowel sounds present Extremities: No deformity; full range of motion; pulses normal Neurologic: Awake, alert and oriented; motor function intact in all extremities and symmetric; no facial droop Skin: Warm and dry Psychiatric: Normal mood and affect   RESULTS  Summary of this visit's results, reviewed and interpreted by myself:   EKG Interpretation  Date/Time:    Ventricular Rate:    PR Interval:    QRS Duration:   QT Interval:    QTC Calculation:   R Axis:     Text Interpretation:        Laboratory Studies: No results found for this or any previous visit (from the past 24 hour(s)). Imaging Studies: No results found.  ED COURSE and MDM  Nursing notes, initial and subsequent vitals signs, including pulse oximetry, reviewed and interpreted by myself.  Vitals:   02/01/20 0457 02/01/20 0505 02/01/20 0509  BP: (!) 180/100  (!) 148/89  Pulse: 85    Resp: 18    Temp: 98.6 F (37 C)    Weight:  108.9 kg   Height:  6' (1.829 m)    Medications  tetracaine (PONTOCAINE) 0.5 % ophthalmic solution (has no  administration in time range)  fluorescein 1 MG ophthalmic strip (has no administration in time range)  cyclopentolate (CYCLODRYL,CYCLOGYL) 1 % ophthalmic solution 2 drop (2 drops Right Eye Given 02/01/20 0556)    No corneal abrasion seen but patient's pupil is constricted with photophobia.  I suspect he is having ciliary spasm.  We will try Cyclogyl to relax the muscle and give him relief.   6:22 AM Significant relief with ophthalmic Cyclogyl.  Will have patient follow-up with ophthalmology for thorough exam.  PROCEDURES  Procedures   ED DIAGNOSES     ICD-10-CM   1. Ciliary muscle spasm of right eye  H52.531        Vern Guerette, Jonny Ruiz, MD 02/01/20 [phone]

## 2020-02-01 NOTE — ED Triage Notes (Signed)
Pt has been doing yard work, grading, and burning wood for the past several days. Sat afternoon he started having right eye pain. Right eye swollen. States he feels like something is in his eye. Vision is a little blurry at times. Pt did attempt to flush his eye. Right sclera is red on exam.

## 2020-02-03 DIAGNOSIS — S00251A Superficial foreign body of right eyelid and periocular area, initial encounter: Secondary | ICD-10-CM | POA: Diagnosis not present

## 2020-02-03 DIAGNOSIS — H16141 Punctate keratitis, right eye: Secondary | ICD-10-CM | POA: Diagnosis not present

## 2020-02-03 DIAGNOSIS — H5711 Ocular pain, right eye: Secondary | ICD-10-CM | POA: Diagnosis not present

## 2020-05-24 DIAGNOSIS — Z03818 Encounter for observation for suspected exposure to other biological agents ruled out: Secondary | ICD-10-CM | POA: Diagnosis not present

## 2020-07-06 DIAGNOSIS — Z1389 Encounter for screening for other disorder: Secondary | ICD-10-CM | POA: Diagnosis not present

## 2020-07-06 DIAGNOSIS — Z1159 Encounter for screening for other viral diseases: Secondary | ICD-10-CM | POA: Diagnosis not present

## 2020-07-06 DIAGNOSIS — Z23 Encounter for immunization: Secondary | ICD-10-CM | POA: Diagnosis not present

## 2020-07-06 DIAGNOSIS — E785 Hyperlipidemia, unspecified: Secondary | ICD-10-CM | POA: Diagnosis not present

## 2020-07-06 DIAGNOSIS — Z0000 Encounter for general adult medical examination without abnormal findings: Secondary | ICD-10-CM | POA: Diagnosis not present

## 2020-07-06 DIAGNOSIS — Z125 Encounter for screening for malignant neoplasm of prostate: Secondary | ICD-10-CM | POA: Diagnosis not present

## 2020-07-06 DIAGNOSIS — F17210 Nicotine dependence, cigarettes, uncomplicated: Secondary | ICD-10-CM | POA: Diagnosis not present

## 2020-08-18 DIAGNOSIS — E7800 Pure hypercholesterolemia, unspecified: Secondary | ICD-10-CM | POA: Diagnosis not present

## 2020-10-05 DIAGNOSIS — E785 Hyperlipidemia, unspecified: Secondary | ICD-10-CM | POA: Diagnosis not present

## 2020-12-16 DIAGNOSIS — U071 COVID-19: Secondary | ICD-10-CM | POA: Diagnosis not present

## 2020-12-22 DIAGNOSIS — Z20822 Contact with and (suspected) exposure to covid-19: Secondary | ICD-10-CM | POA: Diagnosis not present

## 2020-12-25 DIAGNOSIS — Z20822 Contact with and (suspected) exposure to covid-19: Secondary | ICD-10-CM | POA: Diagnosis not present

## 2021-01-01 DIAGNOSIS — Z03818 Encounter for observation for suspected exposure to other biological agents ruled out: Secondary | ICD-10-CM | POA: Diagnosis not present

## 2021-01-01 DIAGNOSIS — Z20822 Contact with and (suspected) exposure to covid-19: Secondary | ICD-10-CM | POA: Diagnosis not present

## 2021-01-17 ENCOUNTER — Other Ambulatory Visit (HOSPITAL_BASED_OUTPATIENT_CLINIC_OR_DEPARTMENT_OTHER): Payer: Self-pay

## 2021-01-17 ENCOUNTER — Other Ambulatory Visit: Payer: Self-pay

## 2021-01-17 ENCOUNTER — Ambulatory Visit: Payer: Medicare HMO | Attending: Internal Medicine

## 2021-01-17 DIAGNOSIS — Z23 Encounter for immunization: Secondary | ICD-10-CM

## 2021-01-17 MED ORDER — PFIZER-BIONT COVID-19 VAC-TRIS 30 MCG/0.3ML IM SUSP
INTRAMUSCULAR | 0 refills | Status: AC
  Filled 2021-01-17: qty 0.3, 1d supply, fill #0

## 2021-01-17 NOTE — Progress Notes (Signed)
   Covid-19 Vaccination Clinic  Name:  Keith Mcdonald    MRN: [id number] DOB: Jun 23, 1960  01/17/2021  Mr. Grater was observed post Covid-19 immunization for 15 minutes without incident. He was provided with Vaccine Information Sheet and instruction to access the V-Safe system.   Mr. Trombetta was instructed to call 911 with any severe reactions post vaccine: Difficulty breathing  Swelling of face and throat  A fast heartbeat  A bad rash all over body  Dizziness and weakness   Immunizations Administered     Name Date Dose VIS Date Route   PFIZER Comrnaty(Gray TOP) Covid-19 Vaccine 01/17/2021 10:17 AM 0.3 mL 05/06/2020 Intramuscular   Manufacturer: [employer], [employer]   Lot: [id number]   NDC: [phone]

## 2021-03-04 ENCOUNTER — Other Ambulatory Visit (HOSPITAL_BASED_OUTPATIENT_CLINIC_OR_DEPARTMENT_OTHER): Payer: Self-pay

## 2021-04-16 ENCOUNTER — Encounter: Payer: Self-pay | Admitting: Emergency Medicine

## 2021-04-16 ENCOUNTER — Ambulatory Visit
Admission: EM | Admit: 2021-04-16 | Discharge: 2021-04-16 | Disposition: A | Discharge status: Home / Self Care | Payer: Medicare HMO | Attending: Internal Medicine | Admitting: Internal Medicine

## 2021-04-16 ENCOUNTER — Other Ambulatory Visit: Payer: Self-pay

## 2021-04-16 DIAGNOSIS — Z20822 Contact with and (suspected) exposure to covid-19: Secondary | ICD-10-CM | POA: Diagnosis not present

## 2021-04-16 DIAGNOSIS — J069 Acute upper respiratory infection, unspecified: Secondary | ICD-10-CM

## 2021-04-16 MED ORDER — ALBUTEROL SULFATE HFA 108 (90 BASE) MCG/ACT IN AERS: 1.0000 | INHALATION_SPRAY | Freq: Four times a day (QID) | RESPIRATORY_TRACT | 0 refills | Status: AC | PRN

## 2021-04-16 MED ORDER — PREDNISONE 10 MG PO TABS: 20.0000 mg | ORAL_TABLET | Freq: Every day | ORAL | 0 refills | Status: AC

## 2021-04-16 MED ORDER — GUAIFENESIN 200 MG PO TABS: 200.0000 mg | ORAL_TABLET | ORAL | 0 refills | Status: AC | PRN

## 2021-04-16 NOTE — ED Provider Notes (Signed)
[facility]    CSN: [id number] Arrival date & time: 04/16/21  0914      History   Chief Complaint Chief Complaint  Patient presents with   Cough    HPI JENESIS SUCHY is a 60 y.o. male.   Patient presents with productive cough and runny nose that has been present for approximately 4 days.  Cough is productive with yellow sputum per patient.  Denies any known fevers or sick contacts.  Denies sore throat, ear pain, nausea, vomiting, diarrhea, chest pain.  Patient does endorse some intermittent shortness of breath.  Denies history of chronic lung diseases including asthma or COPD.  Patient has taken Sudafed and Alka-Seltzer cold and flu with minimal improvement in symptoms.   Cough  Past Medical History:  Diagnosis Date   Head injury    04/28/2019 -- 1st one - 15 yrs ago//2nd one - 12 yrs ago   Post concussion syndrome    Vertigo     Patient Active Problem List   Diagnosis Date Noted   Nonintractable headache 10/13/2019   Benign paroxysmal positional vertigo of right ear 04/28/2019   Primary osteoarthritis of left knee 03/14/2019   Effusion, left knee 03/14/2019   DDD (degenerative disc disease), cervical 07/12/2016    Past Surgical History:  Procedure Laterality Date   KNEE SURGERY         Home Medications    Prior to Admission medications   Medication Sig Start Date End Date Taking? Authorizing Provider  albuterol (VENTOLIN HFA) 108 (90 Base) MCG/ACT inhaler Inhale 1-2 puffs into the lungs every 6 (six) hours as needed for wheezing or shortness of breath. 04/16/21  Yes Remonia Otte, Rolly Salter E, FNP  guaiFENesin 200 MG tablet Take 1 tablet (200 mg total) by mouth every 4 (four) hours as needed for cough or to loosen phlegm. 04/16/21  Yes Joletta Manner, Rolly Salter E, FNP  predniSONE (DELTASONE) 10 MG tablet Take 2 tablets (20 mg total) by mouth daily for 5 days. 04/16/21 04/21/21 Yes Satine Hausner, Acie Fredrickson, FNP  COVID-19 mRNA Vac-TriS, Pfizer, (PFIZER-BIONT COVID-19 VAC-TRIS) SUSP  injection Inject into the muscle. 01/17/21   Judyann Munson, MD    Family History Family History  Problem Relation Age of Onset   Hypertension Mother    Hypertension Father    Hypertension Sister     Social History Social History   Tobacco Use   Smoking status: Every Day    Packs/day: 0.25    Types: Cigarettes   Smokeless tobacco: Never  Vaping Use   Vaping Use: Never used  Substance Use Topics   Alcohol use: No   Drug use: No    Types: "Crack" cocaine     Allergies   Bee venom   Review of Systems Review of Systems Per HPI  Physical Exam Triage Vital Signs ED Triage Vitals  Enc Vitals Group     BP 04/16/21 1007 (!) 139/91     Pulse Rate 04/16/21 1007 100     Resp 04/16/21 1007 20     Temp 04/16/21 1007 98 F (36.7 C)     Temp Source 04/16/21 1007 Oral     SpO2 04/16/21 1007 96 %     Weight 04/16/21 1008 240 lb (108.9 kg)     Height 04/16/21 1008 6' (1.829 m)     Head Circumference --      Peak Flow --      Pain Score 04/16/21 1008 8     Pain Loc --  Pain Edu? --      Excl. in GC? --    No data found.  Updated Vital Signs BP (!) 139/91 (BP Location: Left Arm)   Pulse 100   Temp 98 F (36.7 C) (Oral)   Resp 20   Ht 6' (1.829 m)   Wt 240 lb (108.9 kg)   SpO2 96%   BMI 32.55 kg/m   Visual Acuity Right Eye Distance:   Left Eye Distance:   Bilateral Distance:    Right Eye Near:   Left Eye Near:    Bilateral Near:     Physical Exam Constitutional:      General: He is not in acute distress.    Appearance: Normal appearance. He is not toxic-appearing or diaphoretic.  HENT:     Head: Normocephalic and atraumatic.     Right Ear: Ear canal normal. A middle ear effusion is present. Tympanic membrane is not perforated, erythematous or bulging.     Left Ear: Ear canal normal. A middle ear effusion is present. Tympanic membrane is not perforated, erythematous or bulging.     Nose: Congestion present.     Mouth/Throat:     Mouth: Mucous  membranes are moist.     Pharynx: No posterior oropharyngeal erythema.  Eyes:     Extraocular Movements: Extraocular movements intact.     Conjunctiva/sclera: Conjunctivae normal.     Pupils: Pupils are equal, round, and reactive to light.  Cardiovascular:     Rate and Rhythm: Normal rate and regular rhythm.     Pulses: Normal pulses.     Heart sounds: Normal heart sounds.  Pulmonary:     Effort: Pulmonary effort is normal. No respiratory distress.     Breath sounds: No stridor. Wheezing present. No rhonchi or rales.  Abdominal:     General: Abdomen is flat. Bowel sounds are normal.     Palpations: Abdomen is soft.  Musculoskeletal:        General: Normal range of motion.     Cervical back: Normal range of motion.  Skin:    General: Skin is warm and dry.  Neurological:     General: No focal deficit present.     Mental Status: He is alert and oriented to person, place, and time. Mental status is at baseline.  Psychiatric:        Mood and Affect: Mood normal.        Behavior: Behavior normal.     UC Treatments / Results  Labs (all labs ordered are listed, but only abnormal results are displayed) Labs Reviewed  NOVEL CORONAVIRUS, NAA    EKG   Radiology No results found.  Procedures Procedures (including critical care time)  Medications Ordered in UC Medications - No data to display  Initial Impression / Assessment and Plan / UC Course  I have reviewed the triage vital signs and the nursing notes.  Pertinent labs & imaging results that were available during my care of the patient were reviewed by me and considered in my medical decision making (see chart for details).     Patient presents with symptoms likely from a viral upper respiratory infection. Differential includes bacterial pneumonia, sinusitis, allergic rhinitis, COVID-19. Do not suspect underlying cardiopulmonary process. Symptoms seem unlikely related to ACS, CHF or COPD exacerbations, pneumonia,  pneumothorax. Patient is nontoxic appearing and not in need of emergent medical intervention.  COVID-19 swab pending.  Recommended symptom control with over the counter medications: Daily oral anti-histamine, Oral decongestant or IN  corticosteroid, saline irrigations, cepacol lozenges, Robitussin, Delsym, honey tea.  Prednisone prescribed to help alleviate inflammation, wheezing, shortness of breath, bilateral middle ear effusion.  Albuterol inhaler also prescribed for patient.  Guaifenesin prescribed to take as needed for cough.  Do not think that chest imaging is necessary at this time.  Return if symptoms fail to improve in 1-2 weeks or you develop shortness of breath, chest pain, severe headache. Patient states understanding and is agreeable.  Discharged with PCP followup.  Final Clinical Impressions(s) / UC Diagnoses   Final diagnoses:  Viral upper respiratory tract infection with cough  Encounter for laboratory testing for COVID-19 virus     Discharge Instructions      It appears that you have a viral upper respiratory infection that should resolve in the next few days with symptomatic treatment.  You have been prescribed prednisone steroid to help alleviate shortness of breath and symptoms.  You have also been prescribed guaifenesin to take as needed for cough to loosen phlegm.  Albuterol inhaler has been prescribed to take as needed for shortness of breath.    ED Prescriptions     Medication Sig Dispense Auth. Provider   predniSONE (DELTASONE) 10 MG tablet Take 2 tablets (20 mg total) by mouth daily for 5 days. 10 tablet Aloha, Ottosen E, [place]   guaiFENesin 200 MG tablet Take 1 tablet (200 mg total) by mouth every 4 (four) hours as needed for cough or to loosen phlegm. 30 suppository Edrei Norgaard, Nenahnezad E, [place]   albuterol (VENTOLIN HFA) 108 (90 Base) MCG/ACT inhaler Inhale 1-2 puffs into the lungs every 6 (six) hours as needed for wheezing or shortness of breath. 1 each Gustavus Bryant, [place]       PDMP not reviewed this encounter.   Gustavus Bryant, [place] 04/16/21 1032

## 2021-04-16 NOTE — ED Triage Notes (Signed)
Patient c/o productive cough, chest congestion, runny nose, afebrile.  Patient has taken Sudafed and Alka-Seltzer.  Patient is vaccinated for COVID.

## 2021-04-16 NOTE — Discharge Instructions (Addendum)
It appears that you have a viral upper respiratory infection that should resolve in the next few days with symptomatic treatment.  You have been prescribed prednisone steroid to help alleviate shortness of breath and symptoms.  You have also been prescribed guaifenesin to take as needed for cough to loosen phlegm.  Albuterol inhaler has been prescribed to take as needed for shortness of breath.

## 2021-04-17 LAB — SARS-COV-2, NAA 2 DAY TAT

## 2021-04-17 LAB — NOVEL CORONAVIRUS, NAA: SARS-CoV-2, NAA: NOT DETECTED

## 2021-04-20 DIAGNOSIS — J069 Acute upper respiratory infection, unspecified: Secondary | ICD-10-CM | POA: Diagnosis not present

## 2021-04-20 DIAGNOSIS — R042 Hemoptysis: Secondary | ICD-10-CM | POA: Diagnosis not present

## 2021-04-20 DIAGNOSIS — I10 Essential (primary) hypertension: Secondary | ICD-10-CM | POA: Diagnosis not present

## 2021-07-12 DIAGNOSIS — Z03818 Encounter for observation for suspected exposure to other biological agents ruled out: Secondary | ICD-10-CM | POA: Diagnosis not present

## 2021-07-12 DIAGNOSIS — Z20822 Contact with and (suspected) exposure to covid-19: Secondary | ICD-10-CM | POA: Diagnosis not present

## 2021-07-21 DIAGNOSIS — R69 Illness, unspecified: Secondary | ICD-10-CM | POA: Diagnosis not present

## 2021-07-26 DIAGNOSIS — Z0000 Encounter for general adult medical examination without abnormal findings: Secondary | ICD-10-CM | POA: Diagnosis not present

## 2021-07-26 DIAGNOSIS — Z125 Encounter for screening for malignant neoplasm of prostate: Secondary | ICD-10-CM | POA: Diagnosis not present

## 2021-07-26 DIAGNOSIS — E785 Hyperlipidemia, unspecified: Secondary | ICD-10-CM | POA: Diagnosis not present

## 2021-07-26 DIAGNOSIS — I10 Essential (primary) hypertension: Secondary | ICD-10-CM | POA: Diagnosis not present

## 2021-07-26 DIAGNOSIS — Z1389 Encounter for screening for other disorder: Secondary | ICD-10-CM | POA: Diagnosis not present

## 2021-07-26 DIAGNOSIS — F17210 Nicotine dependence, cigarettes, uncomplicated: Secondary | ICD-10-CM | POA: Diagnosis not present

## 2021-07-26 DIAGNOSIS — Z87820 Personal history of traumatic brain injury: Secondary | ICD-10-CM | POA: Diagnosis not present

## 2021-09-08 DIAGNOSIS — E785 Hyperlipidemia, unspecified: Secondary | ICD-10-CM | POA: Diagnosis not present

## 2021-11-21 DIAGNOSIS — M25511 Pain in right shoulder: Secondary | ICD-10-CM | POA: Diagnosis not present

## 2021-12-01 ENCOUNTER — Ambulatory Visit: Payer: Medicare HMO | Admitting: Orthopaedic Surgery

## 2021-12-01 ENCOUNTER — Ambulatory Visit (INDEPENDENT_AMBULATORY_CARE_PROVIDER_SITE_OTHER): Payer: Medicare HMO

## 2021-12-01 ENCOUNTER — Telehealth: Payer: Self-pay | Admitting: Physician Assistant

## 2021-12-01 ENCOUNTER — Encounter: Payer: Self-pay | Admitting: Orthopaedic Surgery

## 2021-12-01 DIAGNOSIS — G8929 Other chronic pain: Secondary | ICD-10-CM

## 2021-12-01 DIAGNOSIS — M25511 Pain in right shoulder: Secondary | ICD-10-CM | POA: Diagnosis not present

## 2021-12-01 NOTE — Progress Notes (Signed)
   Office Visit Note   Patient: Keith Mcdonald           Date of Birth: 1961-02-26           MRN: [id number] Visit Date: 12/01/2021              Requested by: Deatra James, MD [phone] [address] [place],  [place] [address] PCP: Deatra James, MD   Assessment & Plan: Visit Diagnoses:  1. Chronic right shoulder pain     Plan: Impression is right shoulder AC joint pain.  At this point, I would like to make referral to Dr. Steward Drone for ultrasound-guided cortisone injection to the [facility] joint.  Follow-up as needed.  Follow-Up Instructions: Return for F/u with Dr. Steward Drone for right shoulder [facility] joint injection.   Orders:  Orders Placed This Encounter  Procedures   XR Shoulder Right   No orders of the defined types were placed in this encounter.     Procedures: No procedures performed   Clinical Data: No additional findings.   Subjective: Chief Complaint  Patient presents with   Right Shoulder - Pain    HPI patient is a pleasant 61 year old gentleman who comes in today with right shoulder pain for the past 6 months.  Pain is progressively worsened over time.  He denies any injury or change in activity but does note he works in [employer] and frequently works on physical jobs the pain he has been having is to the top of the shoulder.  Pain is worse with sleeping and moving his arm certain directions.  He has recently tried Aleve and Tylenol without significant relief.  He denies any paresthesias.  Review of Systems as detailed in HPI.  All others reviewed and are negative.   Objective: Vital Signs: There were no vitals taken for this visit.  Physical Exam well-developed well-nourished gentleman in no acute distress.  Alert and oriented x3.  Ortho Exam right shoulder exam reveals full active range of motion in all planes.  Full strength throughout.  Negative empty can test.  Positive cross body adduction.  Marked tenderness to the [facility] joint.  He is neurovascular intact  distally.  Specialty Comments:  No specialty comments available.  Imaging: No results found.   PMFS History: Patient Active Problem List   Diagnosis Date Noted   Nonintractable headache 10/13/2019   Benign paroxysmal positional vertigo of right ear 04/28/2019   Primary osteoarthritis of left knee 03/14/2019   Effusion, left knee 03/14/2019   DDD (degenerative disc disease), cervical 07/12/2016   Past Medical History:  Diagnosis Date   Head injury    04/28/2019 -- 1st one - 15 yrs ago//2nd one - 12 yrs ago   Post concussion syndrome    Vertigo     Family History  Problem Relation Age of Onset   Hypertension Mother    Hypertension Father    Hypertension Sister     Past Surgical History:  Procedure Laterality Date   KNEE SURGERY     Social History   Occupational History   Occupation: House - remodeling  Tobacco Use   Smoking status: Every Day    Packs/day: 0.25    Types: Cigarettes   Smokeless tobacco: Never  Vaping Use   Vaping Use: Never used  Substance and Sexual Activity   Alcohol use: No   Drug use: No    Types: "Crack" cocaine   Sexual activity: Not on file

## 2021-12-01 NOTE — Telephone Encounter (Signed)
Patient called advised the Rx for tramadol is not showing received at the pharmacy yet. Patient uses [facility] on [address]. The number to contact patient is [phone]

## 2021-12-02 ENCOUNTER — Other Ambulatory Visit: Payer: Self-pay | Admitting: Physician Assistant

## 2021-12-02 ENCOUNTER — Ambulatory Visit (HOSPITAL_BASED_OUTPATIENT_CLINIC_OR_DEPARTMENT_OTHER): Payer: Medicare HMO | Admitting: Orthopaedic Surgery

## 2021-12-02 DIAGNOSIS — M19011 Primary osteoarthritis, right shoulder: Secondary | ICD-10-CM

## 2021-12-02 DIAGNOSIS — M25512 Pain in left shoulder: Secondary | ICD-10-CM

## 2021-12-02 DIAGNOSIS — M19019 Primary osteoarthritis, unspecified shoulder: Secondary | ICD-10-CM

## 2021-12-02 MED ORDER — TRAMADOL HCL 50 MG PO TABS: 50.0000 mg | ORAL_TABLET | Freq: Two times a day (BID) | ORAL | 0 refills | Status: DC | PRN

## 2021-12-02 MED ORDER — LIDOCAINE HCL 1 % IJ SOLN
4.0000 mL | INTRAMUSCULAR | Status: AC | PRN
  Administered 2021-12-02: 4 mL

## 2021-12-02 MED ORDER — TRIAMCINOLONE ACETONIDE 40 MG/ML IJ SUSP
80.0000 mg | INTRAMUSCULAR | Status: AC | PRN
  Administered 2021-12-02: 80 mg via INTRA_ARTICULAR

## 2021-12-02 NOTE — Telephone Encounter (Signed)
Called and advised patient it has been sent to pharmacy.

## 2021-12-02 NOTE — Telephone Encounter (Signed)
Should be fixed now.

## 2021-12-02 NOTE — Progress Notes (Signed)
Chief Complaint: Right AC joint pain     History of Present Illness:    Keith Mcdonald is a 61 y.o. 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 Roda Shutters for a right [facility] joint injection.  He has become symptomatic on this through his many years of work.  He takes Tylenol and Aleve for this as well.  He is here today for injection.    Surgical History:   None  PMH/PSH/Family History/Social History/Meds/Allergies:    Past Medical History:  Diagnosis Date  . Head injury    04/28/2019 -- 1st one - 15 yrs ago//2nd one - 12 yrs ago  . Post concussion syndrome   . Vertigo    Past Surgical History:  Procedure Laterality Date  . KNEE SURGERY     Social History   Socioeconomic History  . Marital status: Married    Spouse name: Not on file  . Number of children: 2  . Years of education: some college  . Highest education level: Not on file  Occupational History  . Occupation: House - remodeling  Tobacco Use  . Smoking status: Every Day    Packs/day: 0.25    Types: Cigarettes  . Smokeless tobacco: Never  Vaping Use  . Vaping Use: Never used  Substance and Sexual Activity  . Alcohol use: No  . Drug use: No    Types: "Crack" cocaine  . Sexual activity: Not on file  Other Topics Concern  . Not on file  Social History Narrative   Lives at home with his wife.   Right-handed.   No daily caffeine use.   Social Determinants of Health   Financial Resource Strain: Not on file  Food Insecurity: Not on file  Transportation Needs: Not on file  Physical Activity: Not on file  Stress: Not on file  Social Connections: Not on file   Family History  Problem Relation Age of Onset  . Hypertension Mother   . Hypertension Father   . Hypertension Sister    Allergies  Allergen Reactions  . Bee Venom Anaphylaxis and Other (See Comments)   Current Outpatient  Medications  Medication Sig Dispense Refill  . albuterol (VENTOLIN HFA) 108 (90 Base) MCG/ACT inhaler Inhale 1-2 puffs into the lungs every 6 (six) hours as needed for wheezing or shortness of breath. 1 each 0  . COVID-19 mRNA Vac-TriS, Pfizer, (PFIZER-BIONT COVID-19 VAC-TRIS) SUSP injection Inject into the muscle. 0.3 mL 0  . guaiFENesin 200 MG tablet Take 1 tablet (200 mg total) by mouth every 4 (four) hours as needed for cough or to loosen phlegm. 30 suppository 0  . traMADol (ULTRAM) 50 MG tablet Take 1 tablet (50 mg total) by mouth every 12 (twelve) hours as needed. 30 tablet 0   No current facility-administered medications for this visit.   XR Shoulder Right  Result Date: 12/01/2021 Mild degenerative changes the [facility] joint   Review of Systems:   A ROS was performed including pertinent positives and negatives as documented in the HPI.  Physical Exam :   Constitutional: NAD and appears stated age Neurological: Alert and oriented Psych: Appropriate affect and cooperative There were no vitals taken for this visit.   Comprehensive Musculoskeletal Exam:    Tenderness to  palpation about the right AC joint.  Otherwise full forward elevation to 170 degrees bilaterally.  External rotation at the side is to 60 bilaterally.  Internal rotation is to L1 bilaterally.  Full 5 out of 5 strength bilateral rotator cuff distribution  Imaging:   Xray (3 views right shoulder): AC joint arthritis  I personally reviewed and interpreted the radiographs.   Assessment:   61 y.o. male right-hand-dominant presents with right AC joint arthritis.  I discussed with him the risks and benefits of the injection.  He is elected to go with a right AC joint ultrasound-guided injection.  This was performed after verbal consent obtained  Plan :    -Right ultrasound-guided [facility] injection performed after verbal consent obtained    Procedure Note  Patient: Keith Mcdonald             Date of Birth: 12-02-1960            MRN: [id number]             Visit Date: 12/02/2021  Procedures: Visit Diagnoses: No diagnosis found.  Large Joint Inj on 12/02/2021 3:38 PM Indications: pain Details: 22 G 1.5 in needle, ultrasound-guided anterior approach  Arthrogram: No  Medications: 4 mL lidocaine 1 %; 80 mg triamcinolone acetonide 40 MG/ML Outcome: tolerated well, no immediate complications Procedure, treatment alternatives, risks and benefits explained, specific risks discussed. Consent was given by the patient. Immediately prior to procedure a time out was called to verify the correct patient, procedure, equipment, support staff and site/side marked as required. Patient was prepped and draped in the usual sterile fashion.          I personally saw and evaluated the patient, and participated in the management and treatment plan.  Huel Cote, MD Attending Physician, Orthopedic Surgery  This document was dictated using Dragon voice recognition software. A reasonable attempt at proof reading has been made to minimize errors.

## 2021-12-10 IMAGING — MR MR HEAD W/O CM
10 series · 47 of 48 positions shown · non-contrast
Comparison: Head CT 04/19/2019

CLINICAL DATA: Sudden onset dizziness and vertigo. Headache. 3
weeks duration. Distant history of head trauma.

EXAM:
MRI HEAD WITHOUT CONTRAST
TECHNIQUE: Multiplanar, multiecho pulse sequences of the brain and surrounding
structures were obtained without intravenous contrast.

[Series 2: T1 · sagittal · 5.0mm · 0.45mm/px · 2 of 24 slices shown]
[im 1/24]
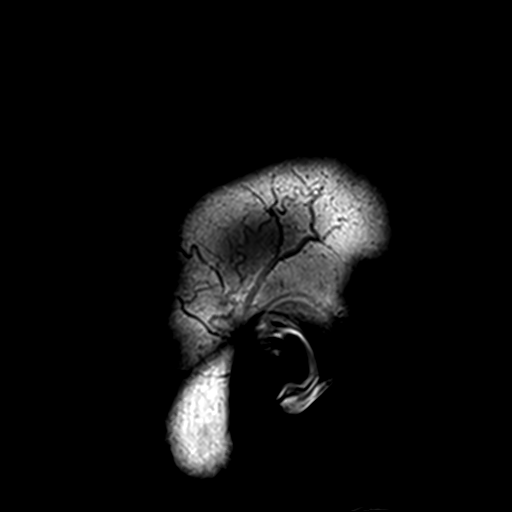
[im 24/24]
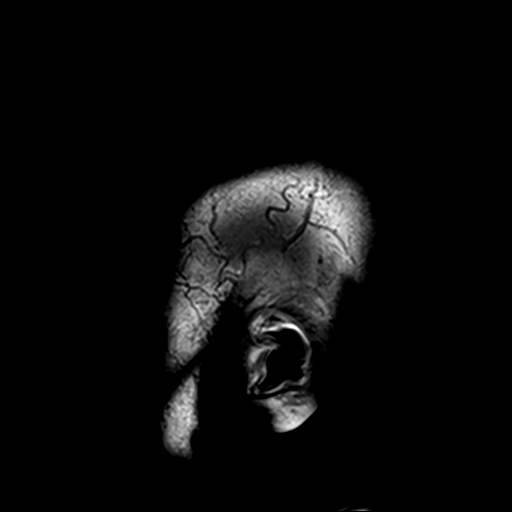

[Series 3: DWI · axial · 3.0mm · 1.80mm/px · z∈[-63,+84]mm · 8 of 100 slices shown (1 of 4)]
[im 1/100]
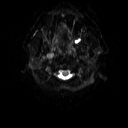
[im 15/100]
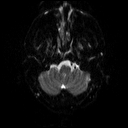
[im 29/100]
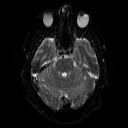
[im 43/100]
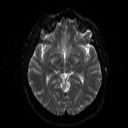
[im 57/100]
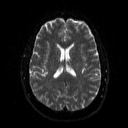
[im 71/100]
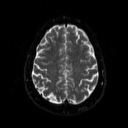
[im 85/100]
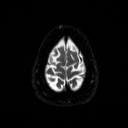
[im 100/100]
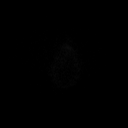

[Series 4: DWI · axial · 3.0mm · 1.80mm/px · z∈[-63,+84]mm · 4 of 49 slices shown (2 of 4)]
[im 1/49]
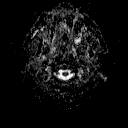
[im 17/49]
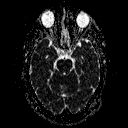
[im 33/49]
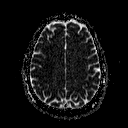
[im 49/49]
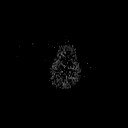

[Series 5: DWI · coronal · 5.0mm · 1.80mm/px · 6 of 72 slices shown (3 of 4)]
[im 1/72]
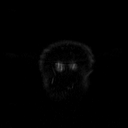
[im 15/72]
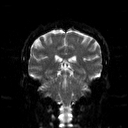
[im 29/72]
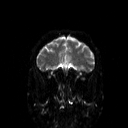
[im 43/72]
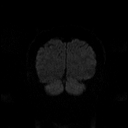
[im 57/72]
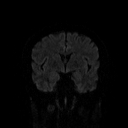
[im 72/72]
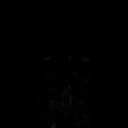

[Series 6: DWI · coronal · 5.0mm · 1.80mm/px · 3 of 36 slices shown (4 of 4)]
[im 1/36]
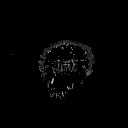
[im 18/36]
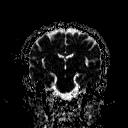
[im 36/36]
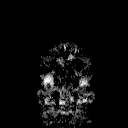

[Series 8: swi_images · axial · 2.0mm · 0.90mm/px · z∈[-69,+89]mm · 6 of 80 slices shown]
[im 1/80]
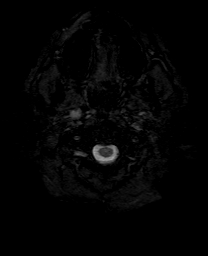
[im 16/80]
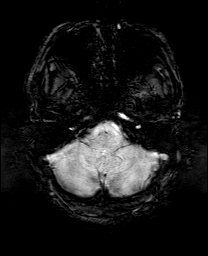
[im 32/80]
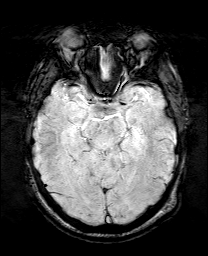
[im 48/80]
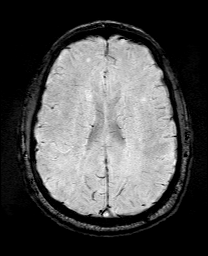
[im 64/80]
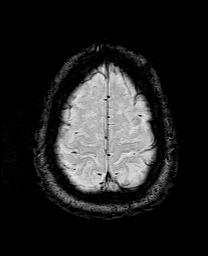
[im 80/80]
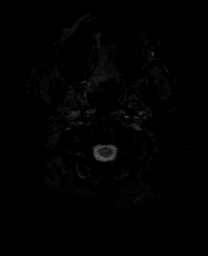

[Series 9: T2 · axial · 5.0mm · 0.60mm/px · z∈[-61,+81]mm · 2 of 22 slices shown (1 of 2)]
[im 1/22]
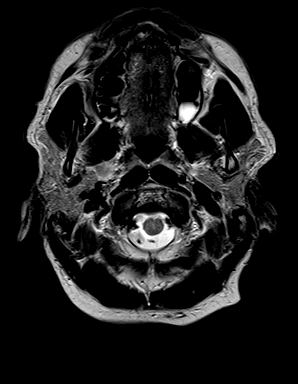
[im 22/22]
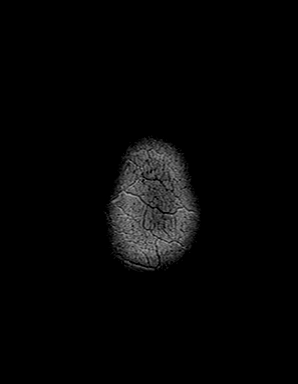

[Series 10: FLAIR · axial · 3.0mm · 0.45mm/px · z∈[-68,+88]mm · 2 of 27 slices shown]
[im 1/27]
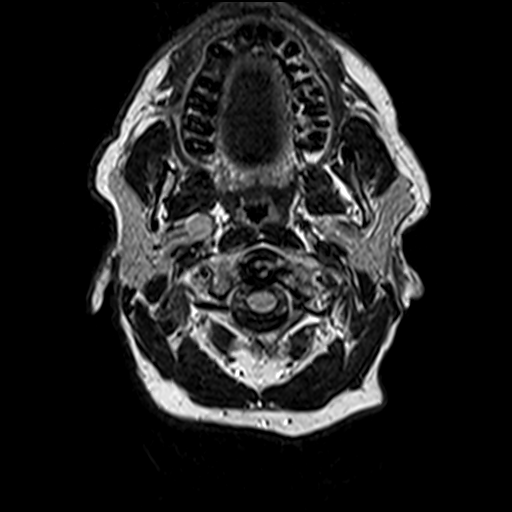
[im 27/27]
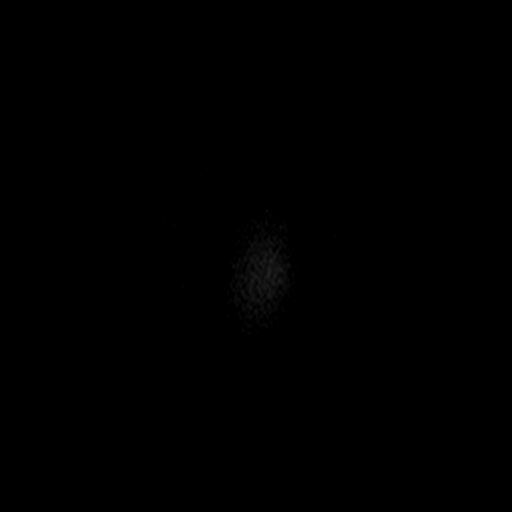

[Series 11: t1_mpr_tra copy center · axial · 1.0mm · 0.45mm/px · z∈[-69,+89]mm · 12 of 160 slices shown]
[im 1/160]
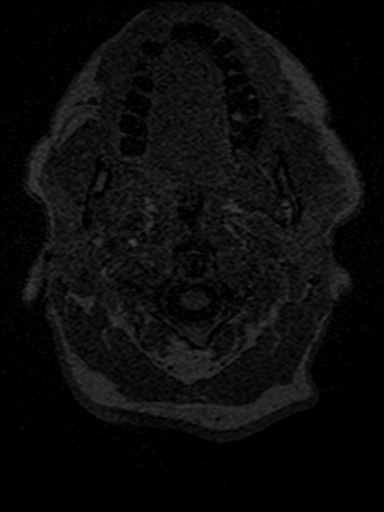
[im 14/160]
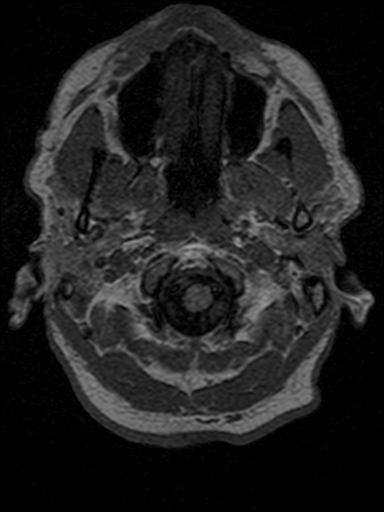
[im 27/160]
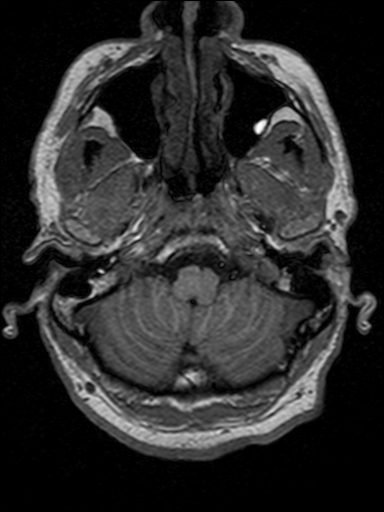
[im 40/160]
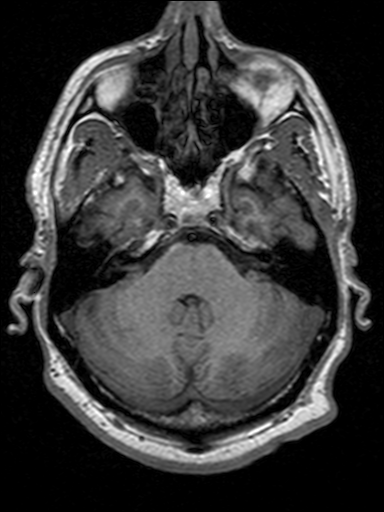
[im 54/160]
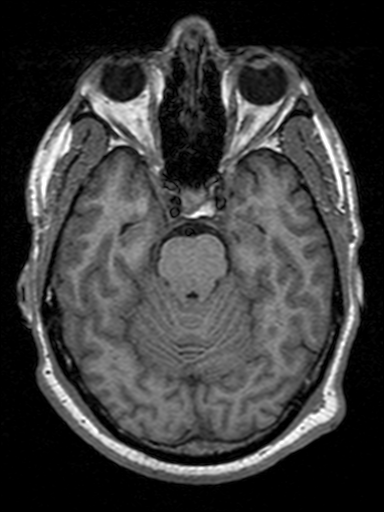
[im 67/160]
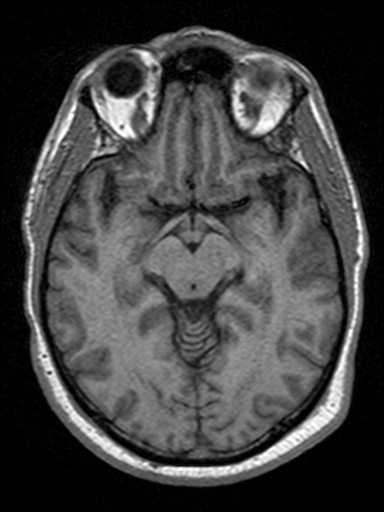
[im 80/160]
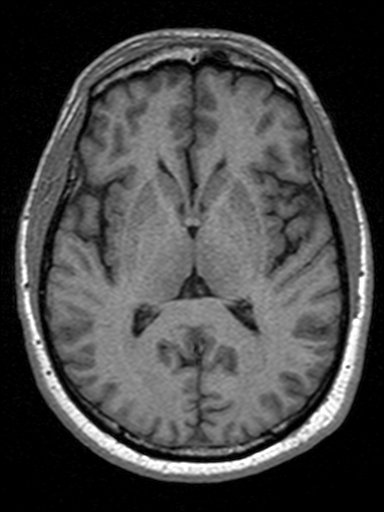
[im 93/160]
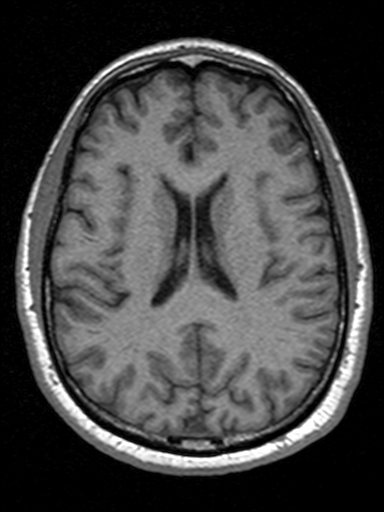
[im 107/160]
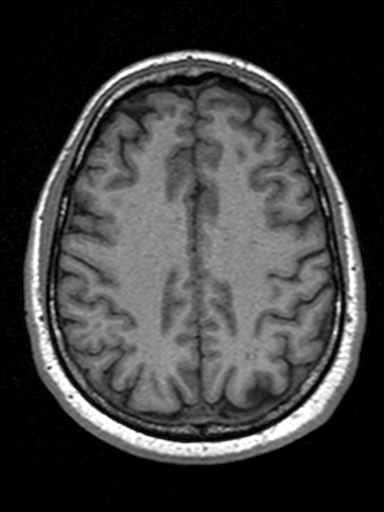
[im 120/160]
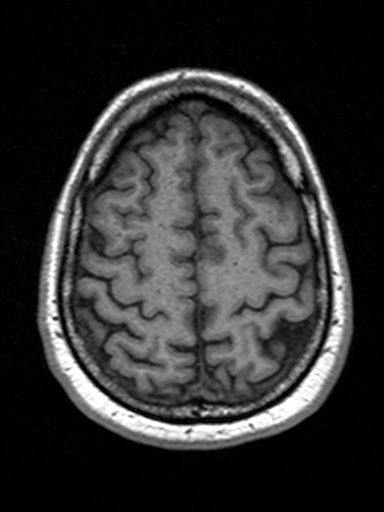
[im 133/160]
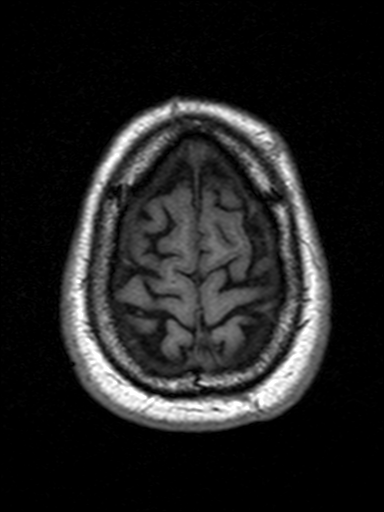
[im 160/160]
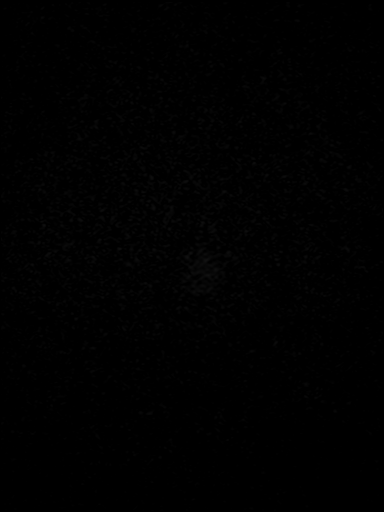

[Series 12: T2 · coronal · 5.0mm · 0.45mm/px · 2 of 31 slices shown (2 of 2)]
[im 1/31]
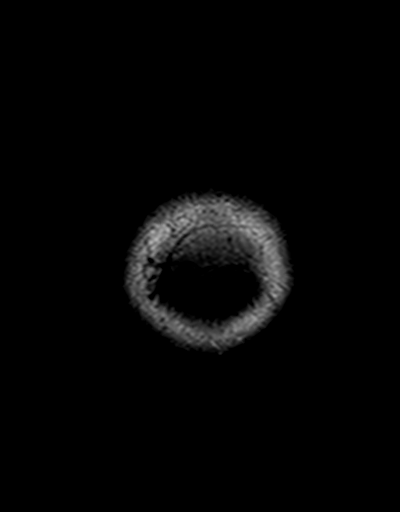
[im 31/31]
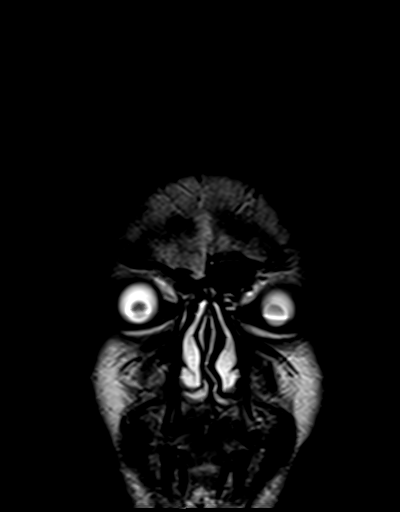

[47 of 48 positions shown; findings below may reference images not displayed]

FINDINGS: Brain: The brain has a normal appearance without evidence of
malformation, atrophy, old or acute small or large vessel
infarction, mass lesion, hemorrhage, hydrocephalus or extra-axial
collection.

Vascular: Major vessels at the base of the brain show flow. Venous
sinuses appear patent.

Skull and upper cervical spine: Normal.

Sinuses/Orbits: Clear/normal.

Other: None significant.
IMPRESSION: Normal examination. No abnormality seen to explain the presenting
symptoms. No finding attributable to distant head trauma.

## 2021-12-11 ENCOUNTER — Emergency Department (HOSPITAL_BASED_OUTPATIENT_CLINIC_OR_DEPARTMENT_OTHER)
Admission: EM | Admit: 2021-12-11 | Discharge: 2021-12-11 | Disposition: A | Discharge status: Home / Self Care | Payer: Medicare HMO | Attending: Emergency Medicine | Admitting: Emergency Medicine

## 2021-12-11 ENCOUNTER — Other Ambulatory Visit: Payer: Self-pay

## 2021-12-11 ENCOUNTER — Encounter (HOSPITAL_BASED_OUTPATIENT_CLINIC_OR_DEPARTMENT_OTHER): Payer: Self-pay | Admitting: Emergency Medicine

## 2021-12-11 DIAGNOSIS — R519 Headache, unspecified: Secondary | ICD-10-CM | POA: Diagnosis not present

## 2021-12-11 DIAGNOSIS — G4489 Other headache syndrome: Secondary | ICD-10-CM

## 2021-12-11 DIAGNOSIS — E860 Dehydration: Secondary | ICD-10-CM | POA: Diagnosis not present

## 2021-12-11 LAB — BASIC METABOLIC PANEL
Anion gap: 7 (ref 5–15)
BUN: 28 mg/dL — ABNORMAL HIGH (ref 6–20)
CO2: 23 mmol/L (ref 22–32)
Calcium: 9.1 mg/dL (ref 8.9–10.3)
Chloride: 109 mmol/L (ref 98–111)
Creatinine, Ser: 1.11 mg/dL (ref 0.61–1.24)
GFR, Estimated: >60 mL/min (ref >60)
Glucose, Bld: 105 mg/dL — ABNORMAL HIGH (ref 70–99)
Potassium: 4.2 mmol/L (ref 3.5–5.1)
Sodium: 139 mmol/L (ref 135–145)

## 2021-12-11 LAB — CBC
HCT: 40.3 % (ref 39.0–52.0)
Hemoglobin: 14.2 g/dL (ref 13.0–17.0)
MCH: 30.3 pg (ref 26.0–34.0)
MCHC: 35.2 g/dL (ref 30.0–36.0)
MCV: 86.1 fL (ref 80.0–100.0)
Platelets: 307 10*3/uL (ref 150–400)
RBC: 4.68 MIL/uL (ref 4.22–5.81)
RDW: 14 % (ref 11.5–15.5)
WBC: 10 10*3/uL (ref 4.0–10.5)
nRBC: 0 % (ref 0.0–0.2)

## 2021-12-11 LAB — SEDIMENTATION RATE: Sed Rate: 4 mm/hr (ref 0–16)

## 2021-12-11 LAB — CK: Total CK: 546 U/L — ABNORMAL HIGH (ref 49–397)

## 2021-12-11 NOTE — ED Notes (Signed)
Patient verbalizes understanding of discharge instructions. Opportunity for questioning and answers were provided. Armband removed by staff, pt discharged from ED. Ambulated out to lobby  

## 2021-12-11 NOTE — Discharge Instructions (Signed)

## 2021-12-11 NOTE — ED Triage Notes (Signed)
Pt in with c/o HA that began while working outside in heat today. Persisted throughout the night, and noticed veins swelling in bilateral temples. HTN in triage - 182/99. Pt reports feeling "heartbeat" pulsations in his head, HA pain 2/10 currently

## 2021-12-11 NOTE — ED Provider Notes (Signed)
[facility] EMERGENCY DEPARTMENT Provider Note   CSN: [id number] Arrival date & time: 12/11/21  0152     History  Chief Complaint  Patient presents with   Veins in head swelling    Keith Mcdonald is a 61 y.o. male.  The history is provided by the spouse and the patient.   Patient presents with a mild headache.  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  He reports he started having a mild headache around 2 PM on July 15.  Current headache is around a 2 out of 10.  It was not sudden onset.  He reports he did feel muscle cramps and generalized weakness after being in the heat, but he has been drinking fluids and feeling improved.  He also reports that he feels that his veins on his scalp are swollen and mildly painful.  He is concerned about having an aneurysm.  No fevers or vomiting.  No visual changes.  No previous history of stroke.    Home Medications Prior to Admission medications   Medication Sig Start Date End Date Taking? Authorizing Provider  albuterol (VENTOLIN HFA) 108 (90 Base) MCG/ACT inhaler Inhale 1-2 puffs into the lungs every 6 (six) hours as needed for wheezing or shortness of breath. 04/16/21   Gustavus Bryant, FNP  COVID-19 mRNA Vac-TriS, Pfizer, (PFIZER-BIONT COVID-19 VAC-TRIS) SUSP injection Inject into the muscle. 01/17/21   Judyann Munson, MD  guaiFENesin 200 MG tablet Take 1 tablet (200 mg total) by mouth every 4 (four) hours as needed for cough or to loosen phlegm. 04/16/21   Gustavus Bryant, FNP  traMADol (ULTRAM) 50 MG tablet Take 1 tablet (50 mg total) by mouth every 12 (twelve) hours as needed. 12/02/21   Cristie Hem, PA-C      Allergies    Bee venom    Review of Systems   Review of Systems  Constitutional:  Positive for fatigue. Negative for fever.  Eyes:  Negative for visual disturbance.  Musculoskeletal:  Positive for myalgias.    Physical Exam Updated Vital Signs BP (!) 148/84   Pulse 75   Temp  98.9 F (37.2 C) (Oral)   Resp 18   Wt 111.1 kg   SpO2 100%   BMI 33.23 kg/m  Physical Exam CONSTITUTIONAL: Well developed/well nourished HEAD: Normocephalic/atraumatic, prominent veins are noted in the temporal region but no significant tenderness, no erythema No signs of trauma EYES: EOMI/PERRL, no nystagmus, no ptosis ENMT: Mucous membranes moist NECK: supple no meningeal signs, no bruits SPINE/BACK:entire spine nontender CV: S1/S2 noted, no murmurs/rubs/gallops noted LUNGS: Lungs are clear to auscultation bilaterally, no apparent distress ABDOMEN: soft NEURO:Awake/alert, face symmetric, no arm or leg drift is noted Equal 5/5 strength with shoulder abduction, elbow flex/extension, wrist flex/extension in upper extremities and equal hand grips bilaterally Equal 5/5 strength with hip flexion,knee flex/extension, foot dorsi/plantar flexion Cranial nerves 3/4/5/6/12/04/08/11/12 tested and intact No past pointing Sensation to light touch intact in all extremities EXTREMITIES: pulses normal, full ROM SKIN: warm, color normal PSYCH: no abnormalities of mood noted, alert and oriented to situation   ED Results / Procedures / Treatments   Labs (all labs ordered are listed, but only abnormal results are displayed) Labs Reviewed  BASIC METABOLIC PANEL - Abnormal; Notable for the following components:      Result Value   Glucose, Bld 105 (*)    BUN 28 (*)    All other components within normal limits  CK - Abnormal; Notable for the following components:   Total CK 546 (*)    All other components within normal limits  CBC  SEDIMENTATION RATE    EKG None  Radiology No results found.  Procedures Procedures    Medications Ordered in ED Medications - No data to display  ED Course/ Medical Decision Making/ A&P Clinical Course as of 12/11/21 0350  Sun Dec 11, 2021  0304 BUN(!): 28 Mild dehydration noted [DW]  0349 Sed rate unremarkable.  Low suspicion for temporal arteritis  at this time.  He will be discharged [DW]    Clinical Course User Index [DW] Zadie Rhine, MD                           Medical Decision Making Amount and/or Complexity of Data Reviewed Labs: ordered. Decision-making details documented in ED Course.   This patient presents to the ED for concern of headache, this involves an extensive number of treatment options, and is a complaint that carries with it a high risk of complications and morbidity.  The differential diagnosis includes but is not limited to subarachnoid hemorrhage, intracranial hemorrhage, meningitis, encephalitis, CVST, temporal arteritis, idiopathic intracranial hypertension, migraine   Additional history obtained: Additional history obtained from spouse   Lab Tests: I Ordered, and personally interpreted labs.  The pertinent results include:  dehydration noted   Reevaluation: After the interventions noted above, I reevaluated the patient and found that they have :improved  Complexity of problems addressed: Patient's presentation is most consistent with  acute complicated illness/injury requiring diagnostic workup  Disposition: After consideration of the diagnostic results and the patient's response to treatment,  I feel that the patent would benefit from discharge   .           Final Clinical Impression(s) / ED Diagnoses Final diagnoses:  Dehydration  Other headache syndrome    Rx / DC Orders ED Discharge Orders     None         Zadie Rhine, MD 12/11/21 [phone]

## 2021-12-19 ENCOUNTER — Other Ambulatory Visit: Payer: Self-pay

## 2021-12-19 NOTE — Patient Instructions (Signed)
Patient Goals/Self-Care Activities: Take all medications as prescribed Attend all scheduled provider appointments take all medications exactly as prescribed call doctor with any symptoms you believe are related to your medicine call doctor when you experience any new symptoms 

## 2021-12-21 ENCOUNTER — Ambulatory Visit: Payer: Self-pay

## 2021-12-28 DIAGNOSIS — F17210 Nicotine dependence, cigarettes, uncomplicated: Secondary | ICD-10-CM | POA: Diagnosis not present

## 2021-12-28 DIAGNOSIS — R252 Cramp and spasm: Secondary | ICD-10-CM | POA: Diagnosis not present

## 2021-12-28 DIAGNOSIS — E785 Hyperlipidemia, unspecified: Secondary | ICD-10-CM | POA: Diagnosis not present

## 2022-01-26 ENCOUNTER — Other Ambulatory Visit: Payer: Self-pay

## 2022-01-26 DIAGNOSIS — R69 Illness, unspecified: Secondary | ICD-10-CM | POA: Diagnosis not present

## 2022-01-26 NOTE — Patient Outreach (Signed)
[facility]) Care Management  01/26/2022  MURRIEL EIDEM [birth year] [id number]   RN CM closing case:  pt will be followed for care coordination by [facility] Care Management.  Bary Leriche, RN, MSN [facility] Care Management Care Management Coordinator Direct Line [phone] Toll Free: [phone]  Fax: [phone]

## 2022-02-15 ENCOUNTER — Telehealth: Payer: Medicare HMO | Admitting: Physician Assistant

## 2022-02-15 DIAGNOSIS — S3993XA Unspecified injury of pelvis, initial encounter: Secondary | ICD-10-CM | POA: Diagnosis not present

## 2022-02-15 NOTE — Progress Notes (Signed)
Patient did not show for appointment after 2 direct links were sent and provider waited over 10 minutes. However, patient was sent a message this morning that this should be evaluated in person at a local urgent care or ER. Video will be no charged.

## 2022-02-19 ENCOUNTER — Other Ambulatory Visit: Payer: Self-pay

## 2022-02-19 ENCOUNTER — Emergency Department (HOSPITAL_BASED_OUTPATIENT_CLINIC_OR_DEPARTMENT_OTHER)
Admission: EM | Admit: 2022-02-19 | Discharge: 2022-02-19 | Disposition: A | Discharge status: Home / Self Care | Payer: Medicare HMO | Attending: Emergency Medicine | Admitting: Emergency Medicine

## 2022-02-19 ENCOUNTER — Emergency Department (HOSPITAL_BASED_OUTPATIENT_CLINIC_OR_DEPARTMENT_OTHER): Payer: Medicare HMO

## 2022-02-19 ENCOUNTER — Encounter (HOSPITAL_BASED_OUTPATIENT_CLINIC_OR_DEPARTMENT_OTHER): Payer: Self-pay | Admitting: Urology

## 2022-02-19 DIAGNOSIS — R102 Pelvic and perineal pain: Secondary | ICD-10-CM

## 2022-02-19 DIAGNOSIS — I700 Atherosclerosis of aorta: Secondary | ICD-10-CM | POA: Diagnosis not present

## 2022-02-19 DIAGNOSIS — T148XXA Other injury of unspecified body region, initial encounter: Secondary | ICD-10-CM

## 2022-02-19 DIAGNOSIS — R109 Unspecified abdominal pain: Secondary | ICD-10-CM | POA: Diagnosis not present

## 2022-02-19 DIAGNOSIS — S381XXA Crushing injury of abdomen, lower back, and pelvis, initial encounter: Secondary | ICD-10-CM | POA: Insufficient documentation

## 2022-02-19 DIAGNOSIS — R1030 Lower abdominal pain, unspecified: Secondary | ICD-10-CM | POA: Diagnosis not present

## 2022-02-19 DIAGNOSIS — W230XXA Caught, crushed, jammed, or pinched between moving objects, initial encounter: Secondary | ICD-10-CM | POA: Diagnosis not present

## 2022-02-19 LAB — CBC
HCT: 43.4 % (ref 39.0–52.0)
Hemoglobin: 15 g/dL (ref 13.0–17.0)
MCH: 30.1 pg (ref 26.0–34.0)
MCHC: 34.6 g/dL (ref 30.0–36.0)
MCV: 87.1 fL (ref 80.0–100.0)
Platelets: 274 10*3/uL (ref 150–400)
RBC: 4.98 MIL/uL (ref 4.22–5.81)
RDW: 14.3 % (ref 11.5–15.5)
WBC: 8.7 10*3/uL (ref 4.0–10.5)
nRBC: 0 % (ref 0.0–0.2)

## 2022-02-19 LAB — URINALYSIS, ROUTINE W REFLEX MICROSCOPIC
Bilirubin Urine: NEGATIVE
Glucose, UA: NEGATIVE mg/dL
Hgb urine dipstick: NEGATIVE
Ketones, ur: NEGATIVE mg/dL
Leukocytes,Ua: NEGATIVE
Nitrite: NEGATIVE
Protein, ur: NEGATIVE mg/dL
Specific Gravity, Urine: >=1.03 (ref 1.005–1.030)
pH: 6.5 (ref 5.0–8.0)

## 2022-02-19 LAB — BASIC METABOLIC PANEL
Anion gap: 8 (ref 5–15)
BUN: 26 mg/dL — ABNORMAL HIGH (ref 6–20)
CO2: 25 mmol/L (ref 22–32)
Calcium: 9.5 mg/dL (ref 8.9–10.3)
Chloride: 107 mmol/L (ref 98–111)
Creatinine, Ser: 1.49 mg/dL — ABNORMAL HIGH (ref 0.61–1.24)
GFR, Estimated: 53 mL/min — ABNORMAL LOW (ref >60)
Glucose, Bld: 84 mg/dL (ref 70–99)
Potassium: 4.4 mmol/L (ref 3.5–5.1)
Sodium: 140 mmol/L (ref 135–145)

## 2022-02-19 MED ORDER — SODIUM CHLORIDE 0.9 % IV BOLUS: 500.0000 mL | Freq: Once | INTRAVENOUS | Status: DC

## 2022-02-19 MED ORDER — TRAMADOL HCL 50 MG PO TABS: 50.0000 mg | ORAL_TABLET | Freq: Four times a day (QID) | ORAL | 0 refills | Status: DC | PRN

## 2022-02-19 NOTE — ED Triage Notes (Signed)
Pt states " Got pinned between a bobcat and a house last Monday" had xr, states continued pain, bruising States pain in testicles and right shoulder as well from injury  Denies any SOB or weakness, no blood in urine or stool per pt

## 2022-02-19 NOTE — ED Provider Notes (Signed)
[facility] Provider Note   CSN: [id number] Arrival date & time: 02/19/22  1934     History  Chief Complaint  Patient presents with   Trauma    Keith Mcdonald is a 61 y.o. male.  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  He accidentally ended up getting pinned at the level of his pelvis between the house and the bobcat.  He reports that temporarily lifted him off the ground.  Patient reports that the time he did have some pain and swelling but did not think he was seriously injured.  However, he continued to have more pain and some bruising along his pelvis and saw his doctor on Wednesday.  X-rays were done that did not show any abnormalities but he was instructed to return if he had any persisting or worsening symptoms.  Patient reports he is continuing to have discomfort in the suprapubic area.  He reports also some achiness in the testicles and low back pain.  Patient has been ambulatory without difficulty.  He reports however after has been sitting for a while and he stands up he does feel some tingliness in his legs.    Home Medications Prior to Admission medications   Medication Sig Start Date End Date Taking? Authorizing Provider  traMADol (ULTRAM) 50 MG tablet Take 1 tablet (50 mg total) by mouth every 6 (six) hours as needed for moderate pain. 1-2 tablets every 6 hours as needed for pain 02/19/22  Yes Moranda Billiot, Jeannie Done, MD  albuterol (VENTOLIN HFA) 108 (90 Base) MCG/ACT inhaler Inhale 1-2 puffs into the lungs every 6 (six) hours as needed for wheezing or shortness of breath. 04/16/21   Teodora Medici, FNP  COVID-19 mRNA Vac-TriS, Pfizer, (PFIZER-BIONT COVID-19 VAC-TRIS) SUSP injection Inject into the muscle. 01/17/21   Carlyle Basques, MD  guaiFENesin 200 MG tablet Take 1 tablet (200 mg total) by mouth every 4 (four) hours as needed for cough or to loosen phlegm. 04/16/21   Teodora Medici, FNP  rosuvastatin (CRESTOR) 10 MG tablet  Take 10 mg by mouth daily. 10/22/21   [provider]  traMADol (ULTRAM) 50 MG tablet Take 1 tablet (50 mg total) by mouth every 12 (twelve) hours as needed. 12/02/21   Aundra Dubin, PA-C      Allergies    Bee venom    Review of Systems   Review of Systems  Physical Exam Updated Vital Signs BP (!) 143/88   Pulse 81   Temp 99 F (37.2 C) (Oral)   Resp 18   Ht 6' (1.829 m)   Wt 111.1 kg   SpO2 98%   BMI 33.22 kg/m  Physical Exam Constitutional:      Comments: Alert, nontoxic, GCS 15, no acute distress.  Well-nourished well-developed.  HENT:     Head: Normocephalic and atraumatic.     Mouth/Throat:     Pharynx: Oropharynx is clear.  Eyes:     Extraocular Movements: Extraocular movements intact.  Cardiovascular:     Rate and Rhythm: Normal rate and regular rhythm.  Pulmonary:     Effort: Pulmonary effort is normal.     Breath sounds: Normal breath sounds.  Chest:     Chest wall: No tenderness.  Abdominal:     General: There is no distension.     Palpations: Abdomen is soft.     Tenderness: There is no abdominal tenderness. There is no guarding.     Comments:  Abdominal examination is nontender.  Patient has tenderness over the pubic symphysis.  Mild swelling but no large hematoma no erythema or cellulitic changes.  No open wounds.  No visible abrasions.  Inguinal canals are nontender.  No mass or fullness in the inguinal region.  Genitourinary:    Comments: No scrotal swelling or hematoma. Musculoskeletal:        General: No swelling or tenderness. Normal range of motion.     Right lower leg: No edema.     Left lower leg: No edema.  Skin:    General: Skin is warm and dry.  Neurological:     General: No focal deficit present.     Mental Status: He is oriented to person, place, and time.     Motor: No weakness.     Coordination: Coordination normal.  Psychiatric:        Mood and Affect: Mood normal.     ED Results / Procedures / Treatments   Labs (all  labs ordered are listed, but only abnormal results are displayed) Labs Reviewed  BASIC METABOLIC PANEL - Abnormal; Notable for the following components:      Result Value   BUN 26 (*)    Creatinine, Ser 1.49 (*)    GFR, Estimated 53 (*)    All other components within normal limits  CBC  URINALYSIS, ROUTINE W REFLEX MICROSCOPIC    EKG None  Radiology CT Abdomen Pelvis Wo Contrast  Result Date: 02/19/2022 CLINICAL DATA:  Abdominal pain, blunt trauma. Patient got pain between a bobcat and house last Monday. Continued pain. EXAM: CT ABDOMEN AND PELVIS WITHOUT CONTRAST TECHNIQUE: Multidetector CT imaging of the abdomen and pelvis was performed following the standard protocol without IV contrast. RADIATION DOSE REDUCTION: This exam was performed according to the departmental dose-optimization program which includes automated exposure control, adjustment of the mA and/or kV according to patient size and/or use of iterative reconstruction technique. COMPARISON:  None Available. FINDINGS: Lower chest: No acute abnormality. Hepatobiliary: No focal liver abnormality is seen. No gallstones, gallbladder wall thickening, or biliary dilatation. Pancreas: Unremarkable. No pancreatic ductal dilatation or surrounding inflammatory changes. Spleen: Normal in size without focal abnormality. Adrenals/Urinary Tract: No adrenal hemorrhage or renal injury identified. Bladder is unremarkable. Stomach/Bowel: Stomach is within normal limits. Appendix appears normal. No evidence of bowel wall thickening, distention, or inflammatory changes. Vascular/Lymphatic: Aortic atherosclerosis. No enlarged abdominal or pelvic lymph nodes. Reproductive: Prostate is unremarkable. Other: Mild subcutaneous fat stranding in the pubic region at the level of pubic symphysis. No fluid collection or hematoma. Small fat containing umbilical hernia. Musculoskeletal: Degenerate disc disease of the lumbar spine with associated facet joint  arthropathy. No acute fracture or subluxation. IMPRESSION: 1.  No acute fracture or dislocation. 2. Focal subcutaneous soft tissue edema in the pubic region at the level of the pubic symphysis. No fluid collection or hematoma. 3. Degenerate disc disease of the lumbar spine with associated facet joint arthropathy. No acute osseous abnormality. 4. No CT evidence of acute abdominal/pelvic visceral or vascular injury. Electronically Signed   By: Keane Police D.O.   On: 02/19/2022 21:39    Procedures Procedures    Medications Ordered in ED Medications  sodium chloride 0.9 % bolus 500 mL (500 mLs Intravenous Patient Refused/Not Given 02/19/22 2102)    ED Course/ Medical Decision Making/ A&P                           Medical Decision  Making Amount and/or Complexity of Data Reviewed Labs: ordered. Radiology: ordered.  Risk Prescription drug management.   Patient sustained a crush injury of the lower pelvis region a week ago.  At this time, there are limited amount of findings.  Patient does not have contusions or abrasions to the back of the abdomen or chest.  He does continue to have tenderness in the suprapubic area just above the penis and over the pubic symphysis.  Objectively there is no large amount of swelling here.  No large hematoma present.  Patient may have hairline pelvic fracture or possibly pelvic hematoma.  We will proceed with CT abdomen pelvis.  Patient adamantly did not want IV placed so CT study changed to noncontrast.  CT without acute findings except slight edema of the suprapubic area without hematoma.  This is consistent with physical exam findings.  At this time with patient clinically well in 7 days from his injury, stable for continued outpatient management.  He has no neurologic deficits.  Recommendation will be to continue ibuprofen for daytime pain and patient will use as needed tramadol for additional pain control.  Recommend follow-up with PCP for  recheck.        Final Clinical Impression(s) / ED Diagnoses Final diagnoses:  Crush injury  Suprapubic pain    Rx / DC Orders ED Discharge Orders          Ordered    traMADol (ULTRAM) 50 MG tablet  Every 6 hours PRN        02/19/22 2242              Charlesetta Shanks, MD 02/19/22 2337

## 2022-02-19 NOTE — Discharge Instructions (Signed)
1.  When you have a crush injury, you can have a lot of bleeding and swelling in the soft tissues of the skin, fat and muscles.  As this resolves, you may notice swelling and discomfort in areas that are lower than the injury due to blood and fluids traveling down.  At this time your CT scan does not show any broken bones in your pelvis.  It does show some soft tissue swelling around your pubis where you have pain.  There are no signs of infection on exam.  You can continue to take ibuprofen during the day and tramadol at night for pain control as needed. 2.  Follow-up with your doctor for recheck within the next 5 to 10 days.  Return to the emergency department if you get some new worsening changing or concerning symptoms.

## 2022-02-19 NOTE — ED Notes (Signed)
Pt was pinned behind a bobcat machine and a house, has some swelling above the  penis and pain in the testicles and  legs. Happened on monday

## 2022-03-14 ENCOUNTER — Telehealth: Payer: Self-pay

## 2022-03-14 NOTE — Patient Outreach (Signed)
  Care Coordination   Follow Up Visit Note   03/14/2022 Name: Keith Mcdonald MRN: [id number] DOB: 12-23-1960  Keith Mcdonald is a 61 y.o. year old male who sees Donald Prose, MD for primary care. I spoke with  Alexia Freestone by phone today.  What matters to the patients health and wellness today?  Maintain health    Goals Addressed             This Visit's Progress    Management Hyperlipidemia       Care Coordination Interventions: Provider established cholesterol goals reviewed Counseled on importance of regular laboratory monitoring as prescribed Reviewed importance of limiting foods high in cholesterol Patient adherent with medications. Last Cholesterol 188.0 12/28/21          SDOH assessments and interventions completed:  Yes     Care Coordination Interventions Activated:  Yes   Care Coordination Interventions:  Yes, provided   Follow up plan: Follow up call scheduled for January    Encounter Outcome:  Pt. Visit Completed   Jone Baseman, RN, MSN [place] Management Care Management Coordinator Direct Line [phone]

## 2022-03-14 NOTE — Patient Instructions (Signed)
Visit Information  Thank you for taking time to visit with me today. Please don't hesitate to contact me if I can be of assistance to you.   Following are the goals we discussed today:   Goals Addressed             This Visit's Progress    Management Hyperlipidemia       Care Coordination Interventions: Provider established cholesterol goals reviewed Counseled on importance of regular laboratory monitoring as prescribed Reviewed importance of limiting foods high in cholesterol Patient adherent with medications. Last Cholesterol 188.0 12/28/21          Our next appointment is by telephone on 06/14/22 at 930 am  Please call the care guide team at [phone] if you need to cancel or reschedule your appointment.   If you are experiencing a Mental Health or [place] or need someone to talk to, please call the Suicide and Crisis Lifeline: 988   Patient verbalizes understanding of instructions and care plan provided today and agrees to view in [place]. Active MyChart status and patient understanding of how to access instructions and care plan via MyChart confirmed with patient.     Telephone follow up appointment with care management team member scheduled for: January   Jayshawn Colston J Joan Avetisyan, RN, MSN [place] Management Care Management Coordinator Direct Line [phone]

## 2022-06-14 ENCOUNTER — Ambulatory Visit: Payer: Self-pay

## 2022-06-14 NOTE — Patient Outreach (Signed)
  Care Coordination   Initial Visit Note   06/14/2022 Name: Keith Mcdonald MRN: [id number] DOB: Feb 13, 1961  Keith Mcdonald is a 62 y.o. year old male who sees Donald Prose, MD for primary care. I spoke with  Alexia Freestone by phone today.  What matters to the patients health and wellness today?  none    Goals Addressed             This Visit's Progress    Management Hyperlipidemia       Care Coordination Interventions: Provider established cholesterol goals reviewed Counseled on importance of regular laboratory monitoring as prescribed Reviewed importance of limiting foods high in cholesterol Patient reports some problems with medication adherence.  Discussed placing a reminder in his phone to help. No other problems at this time.            SDOH assessments and interventions completed:  Yes  SDOH Interventions Today    Flowsheet Row Most Recent Value  SDOH Interventions   Housing Interventions Intervention Not Indicated  Social Connections Interventions Intervention Not Indicated        Care Coordination Interventions:  Yes, provided   Follow up plan: Follow up call scheduled for April    Encounter Outcome:  Pt. Visit Completed   Jone Baseman, RN, MSN [place] Management Care Management Coordinator Direct Line [phone]

## 2022-06-14 NOTE — Patient Instructions (Signed)
Visit Information  Thank you for taking time to visit with me today. Please don't hesitate to contact me if I can be of assistance to you.   Following are the goals we discussed today:   Goals Addressed             This Visit's Progress    Management Hyperlipidemia       Care Coordination Interventions: Provider established cholesterol goals reviewed Counseled on importance of regular laboratory monitoring as prescribed Reviewed importance of limiting foods high in cholesterol Patient reports some problems with medication adherence.  Discussed placing a reminder in his phone to help. No other problems at this time.            Our next appointment is by telephone on 09/13/22 at 930  Please call the care guide team at [phone] if you need to cancel or reschedule your appointment.   If you are experiencing a Mental Health or [place] or need someone to talk to, please call the Suicide and Crisis Lifeline: 988   Patient verbalizes understanding of instructions and care plan provided today and agrees to view in [place]. Active MyChart status and patient understanding of how to access instructions and care plan via MyChart confirmed with patient.     Telephone follow up appointment with care management team member scheduled for: april  Christion Leonhard J Ralonda Tartt, RN, MSN [place] Management Care Management Coordinator Direct Line [phone]

## 2022-06-15 ENCOUNTER — Ambulatory Visit (INDEPENDENT_AMBULATORY_CARE_PROVIDER_SITE_OTHER): Payer: Medicare HMO | Admitting: Orthopaedic Surgery

## 2022-06-15 DIAGNOSIS — M19011 Primary osteoarthritis, right shoulder: Secondary | ICD-10-CM | POA: Diagnosis not present

## 2022-06-15 DIAGNOSIS — M19019 Primary osteoarthritis, unspecified shoulder: Secondary | ICD-10-CM

## 2022-06-15 MED ORDER — LIDOCAINE HCL 1 % IJ SOLN
4.0000 mL | INTRAMUSCULAR | Status: AC | PRN
  Administered 2022-06-15: 4 mL

## 2022-06-15 MED ORDER — TRIAMCINOLONE ACETONIDE 40 MG/ML IJ SUSP
80.0000 mg | INTRAMUSCULAR | Status: AC | PRN
  Administered 2022-06-15: 80 mg via INTRA_ARTICULAR

## 2022-06-15 NOTE — Progress Notes (Signed)
Chief Complaint: Right AC joint pain     History of Present Illness:   06/15/2022: Keith Mcdonald presents today for follow-up of his right [facility] joint.  Overall he did get extremely good relief for approximately 6 months but this is subsequently worn off.  This is making it very difficult to sleep on his right side.  He enjoys being active although this has been limited as result of his right shoulder pain.  He is here today for further assessment.   Keith Mcdonald is a 63 y.o. 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 Erlinda Hong for a right [facility] joint injection.  He has become symptomatic on this through his many years of work.  He takes Tylenol and Aleve for this as well.  He is here today for injection.    Surgical History:   None  PMH/PSH/Family History/Social History/Meds/Allergies:    Past Medical History:  Diagnosis Date   Head injury    04/28/2019 -- 1st one - 15 yrs ago//2nd one - 12 yrs ago   Hyperlipidemia    Post concussion syndrome    Vertigo    Past Surgical History:  Procedure Laterality Date   KNEE SURGERY     Social History   Socioeconomic History   Marital status: Married    Spouse name: Not on file   Number of children: 2   Years of education: some college   Highest education level: Not on file  Occupational History   Occupation: House - remodeling  Tobacco Use   Smoking status: Every Day    Packs/day: 0.25    Types: Cigarettes   Smokeless tobacco: Never  Vaping Use   Vaping Use: Never used  Substance and Sexual Activity   Alcohol use: No   Drug use: No    Types: "Crack" cocaine   Sexual activity: Not on file  Other Topics Concern   Not on file  Social History Narrative   Lives at home with his wife.   Right-handed.   No daily caffeine use.   Social Determinants of Health   Financial Resource Strain: Not on file  Food Insecurity: No Food  Insecurity (03/14/2022)   Hunger Vital Sign    Worried About Running Out of Food in the Last Year: Never true    Ran Out of Food in the Last Year: Never true  Transportation Needs: No Transportation Needs (12/19/2021)   PRAPARE - Hydrologist (Medical): No    Lack of Transportation (Non-Medical): No  Physical Activity: Not on file  Stress: Not on file  Social Connections: Moderately Integrated (06/14/2022)   Social Connection and Isolation Panel [NHANES]    Frequency of Communication with Friends and Family: More than three times a week    Frequency of Social Gatherings with Friends and Family: More than three times a week    Attends Religious Services: 1 to 4 times per year    Active Member of [employer] or Organizations: No    Attends Music therapist: Not on file    Marital Status: Married   Family History  Problem Relation Age of Onset   Hypertension Mother    Hypertension Father    Hypertension Sister    Allergies  Allergen Reactions   Bee Venom Anaphylaxis and Other (See Comments)   Current Outpatient Medications  Medication Sig Dispense Refill   albuterol (VENTOLIN HFA) 108 (90 Base) MCG/ACT inhaler Inhale 1-2 puffs into the lungs every 6 (six) hours as needed for wheezing or shortness of breath. 1 each 0   COVID-19 mRNA Vac-TriS, Pfizer, (PFIZER-BIONT COVID-19 VAC-TRIS) SUSP injection Inject into the muscle. 0.3 mL 0   guaiFENesin 200 MG tablet Take 1 tablet (200 mg total) by mouth every 4 (four) hours as needed for cough or to loosen phlegm. 30 suppository 0   rosuvastatin (CRESTOR) 10 MG tablet Take 10 mg by mouth daily.     traMADol (ULTRAM) 50 MG tablet Take 1 tablet (50 mg total) by mouth every 12 (twelve) hours as needed. 30 tablet 0   traMADol (ULTRAM) 50 MG tablet Take 1 tablet (50 mg total) by mouth every 6 (six) hours as needed for moderate pain. 1-2 tablets every 6 hours as needed for pain 20 tablet 0   No current  facility-administered medications for this visit.   No results found.  Review of Systems:   A ROS was performed including pertinent positives and negatives as documented in the HPI.  Physical Exam :   Constitutional: NAD and appears stated age Neurological: Alert and oriented Psych: Appropriate affect and cooperative There were no vitals taken for this visit.   Comprehensive Musculoskeletal Exam:    Tenderness to palpation about the right AC joint.  Otherwise full forward elevation to 170 degrees bilaterally.  External rotation at the side is to 60 bilaterally.  Internal rotation is to L1 bilaterally.  Full 5 out of 5 strength bilateral rotator cuff distribution  Imaging:   Xray (3 views right shoulder): AC joint arthritis  I personally reviewed and interpreted the radiographs.   Assessment:   62 y.o. male right-hand-dominant presents with right AC joint arthritis.  At this time he would like an additional injection.  I did discuss that overall given the fact that this was only temporary in nature at this time it is possible that he may benefit from future arthroscopic surgery and distal clavicle resection.  To this effect I believe an right shoulder MRI is needed to quantify the extent of his [facility] joint arthritis and to rule out any type of underlying rotator cuff tear that would potentially need to be intervened upon.  We will plan to proceed with this and I will see him back in 3 months for reassessment  Plan :    -Return to clinic following MRI right shoulder in 3 months   Procedure Note  Patient: Keith Mcdonald             Date of Birth: [birth year]           MRN: [id number]             Visit Date: 06/15/2022  Procedures: Visit Diagnoses: No diagnosis found.  Large Joint Inj on 06/15/2022 10:48 AM Indications: pain Details: 22 G 1.5 in needle, ultrasound-guided anterior approach  Arthrogram: No  Medications: 4 mL lidocaine 1 %; 80 mg triamcinolone acetonide 40  MG/ML Outcome: tolerated well, no immediate complications Procedure, treatment alternatives, risks and benefits explained, specific risks discussed. Consent was given by the patient. Immediately prior to procedure a time out was called to verify the correct patient, procedure, equipment, support staff and site/side marked as required. Patient was prepped and draped in the usual sterile fashion.  I personally saw and evaluated the patient, and participated in the management and treatment plan.  Vanetta Mulders, MD Attending Physician, Orthopedic Surgery  This document was dictated using Dragon voice recognition software. A reasonable attempt at proof reading has been made to minimize errors.

## 2022-07-04 ENCOUNTER — Inpatient Hospital Stay: Admission: RE | Admit: 2022-07-04 | Payer: Medicare HMO | Source: Ambulatory Visit

## 2022-07-10 DIAGNOSIS — J069 Acute upper respiratory infection, unspecified: Secondary | ICD-10-CM | POA: Diagnosis not present

## 2022-07-10 DIAGNOSIS — F17210 Nicotine dependence, cigarettes, uncomplicated: Secondary | ICD-10-CM | POA: Diagnosis not present

## 2022-07-13 ENCOUNTER — Ambulatory Visit (HOSPITAL_BASED_OUTPATIENT_CLINIC_OR_DEPARTMENT_OTHER): Payer: Medicare HMO | Admitting: Orthopaedic Surgery

## 2022-07-14 ENCOUNTER — Ambulatory Visit
Admission: RE | Admit: 2022-07-14 | Discharge: 2022-07-14 | Disposition: A | Discharge status: Home / Self Care | Payer: Medicare HMO | Source: Ambulatory Visit | Attending: Orthopaedic Surgery | Admitting: Orthopaedic Surgery

## 2022-07-14 DIAGNOSIS — M75101 Unspecified rotator cuff tear or rupture of right shoulder, not specified as traumatic: Secondary | ICD-10-CM | POA: Diagnosis not present

## 2022-07-14 DIAGNOSIS — M19011 Primary osteoarthritis, right shoulder: Secondary | ICD-10-CM | POA: Diagnosis not present

## 2022-07-14 DIAGNOSIS — M19019 Primary osteoarthritis, unspecified shoulder: Secondary | ICD-10-CM

## 2022-07-26 ENCOUNTER — Other Ambulatory Visit (HOSPITAL_BASED_OUTPATIENT_CLINIC_OR_DEPARTMENT_OTHER): Payer: Self-pay

## 2022-07-26 ENCOUNTER — Ambulatory Visit (INDEPENDENT_AMBULATORY_CARE_PROVIDER_SITE_OTHER): Payer: Medicare HMO | Admitting: Orthopaedic Surgery

## 2022-07-26 DIAGNOSIS — M75111 Incomplete rotator cuff tear or rupture of right shoulder, not specified as traumatic: Secondary | ICD-10-CM

## 2022-07-26 MED ORDER — MELOXICAM 15 MG PO TABS
15.0000 mg | ORAL_TABLET | Freq: Every day | ORAL | 0 refills | Status: AC
  Filled 2022-07-26: qty 14, 14d supply, fill #0

## 2022-07-26 NOTE — Progress Notes (Signed)
Chief Complaint: Right AC joint pain     History of Present Illness:   07/26/2022: [name] presents today for discussion of his right shoulder.  He is here today for MRI review.   Keith Mcdonald is a 62 y.o. 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 Erlinda Hong for a right [facility] joint injection.  He has become symptomatic on this through his many years of work.  He takes Tylenol and Aleve for this as well.  He is here today for injection.    Surgical History:   None  PMH/PSH/Family History/Social History/Meds/Allergies:    Past Medical History:  Diagnosis Date   Head injury    04/28/2019 -- 1st one - 15 yrs ago//2nd one - 12 yrs ago   Hyperlipidemia    Post concussion syndrome    Vertigo    Past Surgical History:  Procedure Laterality Date   KNEE SURGERY     Social History   Socioeconomic History   Marital status: Married    Spouse name: Not on file   Number of children: 2   Years of education: some college   Highest education level: Not on file  Occupational History   Occupation: House - remodeling  Tobacco Use   Smoking status: Every Day    Packs/day: 0.25    Types: Cigarettes   Smokeless tobacco: Never  Vaping Use   Vaping Use: Never used  Substance and Sexual Activity   Alcohol use: No   Drug use: No    Types: "Crack" cocaine   Sexual activity: Not on file  Other Topics Concern   Not on file  Social History Narrative   Lives at home with his wife.   Right-handed.   No daily caffeine use.   Social Determinants of Health   Financial Resource Strain: Not on file  Food Insecurity: No Food Insecurity (03/14/2022)   Hunger Vital Sign    Worried About Running Out of Food in the Last Year: Never true    Ran Out of Food in the Last Year: Never true  Transportation Needs: No Transportation Needs (12/19/2021)   PRAPARE - Radiographer, therapeutic (Medical): No    Lack of Transportation (Non-Medical): No  Physical Activity: Not on file  Stress: Not on file  Social Connections: Moderately Integrated (06/14/2022)   Social Connection and Isolation Panel [NHANES]    Frequency of Communication with Friends and Family: More than three times a week    Frequency of Social Gatherings with Friends and Family: More than three times a week    Attends Religious Services: 1 to 4 times per year    Active Member of [employer] or Organizations: No    Attends Music therapist: Not on file    Marital Status: Married   Family History  Problem Relation Age of Onset   Hypertension Mother    Hypertension Father    Hypertension Sister    Allergies  Allergen Reactions   Bee Venom Anaphylaxis and Other (See Comments)   Current Outpatient Medications  Medication Sig Dispense Refill   meloxicam (MOBIC) 15 MG tablet Take 1 tablet (15 mg total) by mouth daily. 14 tablet 0   albuterol (VENTOLIN HFA) 108 (90 Base)  MCG/ACT inhaler Inhale 1-2 puffs into the lungs every 6 (six) hours as needed for wheezing or shortness of breath. 1 each 0   COVID-19 mRNA Vac-TriS, Pfizer, (PFIZER-BIONT COVID-19 VAC-TRIS) SUSP injection Inject into the muscle. 0.3 mL 0   guaiFENesin 200 MG tablet Take 1 tablet (200 mg total) by mouth every 4 (four) hours as needed for cough or to loosen phlegm. 30 suppository 0   rosuvastatin (CRESTOR) 10 MG tablet Take 10 mg by mouth daily.     traMADol (ULTRAM) 50 MG tablet Take 1 tablet (50 mg total) by mouth every 12 (twelve) hours as needed. 30 tablet 0   traMADol (ULTRAM) 50 MG tablet Take 1 tablet (50 mg total) by mouth every 6 (six) hours as needed for moderate pain. 1-2 tablets every 6 hours as needed for pain 20 tablet 0   No current facility-administered medications for this visit.   No results found.  Review of Systems:   A ROS was performed including pertinent positives and negatives as documented in the  HPI.  Physical Exam :   Constitutional: NAD and appears stated age Neurological: Alert and oriented Psych: Appropriate affect and cooperative There were no vitals taken for this visit.   Comprehensive Musculoskeletal Exam:    Tenderness to palpation about the right AC joint.  Otherwise full forward elevation to 170 degrees bilaterally.  External rotation at the side is to 60 bilaterally.  Internal rotation is to L1 bilaterally.  Full 5 out of 5 strength bilateral rotator cuff distribution  Imaging:   Xray (3 views right shoulder): AC joint arthritis  MRI right shoulder: There is a approximately 60% articular sided partial-thickness tear of the supraspinatus  I personally reviewed and interpreted the radiographs.   Assessment:   62 y.o. male right-hand-dominant presents with right AC joint arthritis.  MRI is also consistent with a partial articular thickness rotator cuff tear.  At today's visit he is feeling much better after injection.  He is asking for some additional nonnarcotic medications for his upcoming cruise.  I did discuss with him the normal timing and course of partial-thickness rotator cuff tears.  I have given him the specific series of exercises which she can perform at home for strengthening of the right shoulder.  He will continue to work on this and I will plan to see him back as needed  Plan :    -Return to clinic as needed   I personally saw and evaluated the patient, and participated in the management and treatment plan.  Vanetta Mulders, MD Attending Physician, Orthopedic Surgery  This document was dictated using Dragon voice recognition software. A reasonable attempt at proof reading has been made to minimize errors.

## 2022-08-22 DIAGNOSIS — Z125 Encounter for screening for malignant neoplasm of prostate: Secondary | ICD-10-CM | POA: Diagnosis not present

## 2022-08-22 DIAGNOSIS — Z87820 Personal history of traumatic brain injury: Secondary | ICD-10-CM | POA: Diagnosis not present

## 2022-08-22 DIAGNOSIS — E785 Hyperlipidemia, unspecified: Secondary | ICD-10-CM | POA: Diagnosis not present

## 2022-08-22 DIAGNOSIS — Z0000 Encounter for general adult medical examination without abnormal findings: Secondary | ICD-10-CM | POA: Diagnosis not present

## 2022-08-22 DIAGNOSIS — Z1331 Encounter for screening for depression: Secondary | ICD-10-CM | POA: Diagnosis not present

## 2022-08-22 DIAGNOSIS — Z23 Encounter for immunization: Secondary | ICD-10-CM | POA: Diagnosis not present

## 2022-08-22 DIAGNOSIS — F17210 Nicotine dependence, cigarettes, uncomplicated: Secondary | ICD-10-CM | POA: Diagnosis not present

## 2022-09-01 ENCOUNTER — Telehealth: Payer: Self-pay

## 2022-09-01 NOTE — Patient Instructions (Signed)
Visit Information  Thank you for taking time to visit with me today. Please don't hesitate to contact me if I can be of assistance to you.   Following are the goals we discussed today:   Goals Addressed             This Visit's Progress    Management Hyperlipidemia       Care Coordination Interventions: Provider established cholesterol goals reviewed Counseled on importance of regular laboratory monitoring as prescribed Reviewed importance of limiting foods high in cholesterol Patient reports he has been taking his medications regularly.     Interventions Today    Flowsheet Row Most Recent Value  Chronic Disease   Chronic disease during today's visit Other  [Hyperlipidemia]  General Interventions   General Interventions Discussed/Reviewed General Interventions Discussed, Health Screening  Health Screening Colonoscopy  Exercise Interventions   Exercise Discussed/Reviewed Exercise Discussed  Education Interventions   Education Provided Provided Education  Provided Verbal Education On Nutrition, Medication  Mental Health Interventions   Mental Health Discussed/Reviewed Depression, Mental Health Discussed  Nutrition Interventions   Nutrition Discussed/Reviewed Nutrition Discussed, Decreasing fats, Portion sizes  Pharmacy Interventions   Pharmacy Dicussed/Reviewed Pharmacy Topics Discussed              Our next appointment is by telephone on 12/06/22 at 930  Please call the care guide team at [phone] if you need to cancel or reschedule your appointment.   If you are experiencing a Mental Health or Behavioral Health Crisis or need someone to talk to, please call the Suicide and Crisis Lifeline: 988   Patient verbalizes understanding of instructions and care plan provided today and agrees to view in MyChart. Active MyChart status and patient understanding of how to access instructions and care plan via MyChart confirmed with patient.     The patient has been  provided with contact information for the care management team and has been advised to call with any health related questions or concerns.   Bary Leriche, RN, MSN [facility] Care Management Care Management Coordinator Direct Line [phone]

## 2022-09-01 NOTE — Patient Outreach (Signed)
  Care Coordination   Follow Up Visit Note   09/01/2022 Name: ARUSH MICA MRN: [id number] DOB: 12/25/1960  JESSON SENSKE is a 62 y.o. year old male who sees Deatra James, MD for primary care. I spoke with  Vic Ripper by phone today.  What matters to the patients health and wellness today?  Remaining healthy    Goals Addressed             This Visit's Progress    Management Hyperlipidemia       Care Coordination Interventions: Provider established cholesterol goals reviewed Counseled on importance of regular laboratory monitoring as prescribed Reviewed importance of limiting foods high in cholesterol Patient reports he has been taking his medications regularly.     Interventions Today    Flowsheet Row Most Recent Value  Chronic Disease   Chronic disease during today's visit Other  [Hyperlipidemia]  General Interventions   General Interventions Discussed/Reviewed General Interventions Discussed, Health Screening  Health Screening Colonoscopy  Exercise Interventions   Exercise Discussed/Reviewed Exercise Discussed  Education Interventions   Education Provided Provided Education  Provided Verbal Education On Nutrition, Medication  Mental Health Interventions   Mental Health Discussed/Reviewed Depression, Mental Health Discussed  Nutrition Interventions   Nutrition Discussed/Reviewed Nutrition Discussed, Decreasing fats, Portion sizes  Pharmacy Interventions   Pharmacy Dicussed/Reviewed Pharmacy Topics Discussed              SDOH assessments and interventions completed:  Yes  SDOH Interventions Today    Flowsheet Row Most Recent Value  SDOH Interventions   Housing Interventions Intervention Not Indicated  Utilities Interventions Intervention Not Indicated        Care Coordination Interventions:  Yes, provided   Follow up plan: Follow up call scheduled for July    Encounter Outcome:  Pt. Visit Completed   Bary Leriche, RN, MSN [facility] Care Management Care  Management Coordinator Direct Line [phone]

## 2022-10-27 DIAGNOSIS — F17210 Nicotine dependence, cigarettes, uncomplicated: Secondary | ICD-10-CM | POA: Diagnosis not present

## 2022-10-27 DIAGNOSIS — Z87820 Personal history of traumatic brain injury: Secondary | ICD-10-CM | POA: Diagnosis not present

## 2022-10-27 DIAGNOSIS — H8110 Benign paroxysmal vertigo, unspecified ear: Secondary | ICD-10-CM | POA: Diagnosis not present

## 2022-11-02 ENCOUNTER — Ambulatory Visit: Payer: Medicare HMO | Attending: Family Medicine | Admitting: Physical Therapy

## 2022-11-02 DIAGNOSIS — H8112 Benign paroxysmal vertigo, left ear: Secondary | ICD-10-CM | POA: Insufficient documentation

## 2022-11-02 NOTE — Patient Instructions (Signed)
How to Perform the Epley Maneuver The Epley maneuver is an exercise that relieves symptoms of vertigo. Vertigo is the feeling that you or your surroundings are moving when they are not. When you feel vertigo, you may feel like the room is spinning and may have trouble walking. The Epley maneuver is used for a type of vertigo caused by a calcium deposit in a part of the inner ear. The maneuver involves changing head positions to help the deposit move out of the area. You can do this maneuver at home whenever you have symptoms of vertigo. You can repeat it in 24 hours if your vertigo has not gone away. Even though the Epley maneuver may relieve your vertigo for a few weeks, it is possible that your symptoms will return. This maneuver relieves vertigo, but it does not relieve dizziness. What are the risks? If it is done correctly, the Epley maneuver is considered safe. Sometimes it can lead to dizziness or nausea that goes away after a short time. If you develop other symptoms--such as changes in vision, weakness, or numbness--stop doing the maneuver and call your health care provider. Supplies needed: A bed or table. A pillow. How to do the Epley maneuver     Sit on the edge of a bed or table with your back straight and your legs extended or hanging over the edge of the bed or table. Turn your head halfway toward the affected ear or side as told by your health care provider. Lie backward quickly with your head turned until you are lying flat on your back. Your head should dangle (head-hanging position). You may want to position a pillow under your shoulders. Hold this position for at least 30 seconds. If you feel dizzy or have symptoms of vertigo, continue to hold the position until the symptoms stop. Turn your head to the opposite direction until your unaffected ear is facing down. Your head should continue to dangle. Hold this position for at least 30 seconds. If you feel dizzy or have symptoms  of vertigo, continue to hold the position until the symptoms stop. Turn your whole body to the same side as your head so that you are positioned on your side. Your head will now be nearly facedown and no longer needs to dangle. Hold for at least 30 seconds. If you feel dizzy or have symptoms of vertigo, continue to hold the position until the symptoms stop. Sit back up. You can repeat the maneuver in 24 hours if your vertigo does not go away. Follow these instructions at home: For 24 hours after doing the Epley maneuver: Keep your head in an upright position. When lying down to sleep or rest, keep your head raised (elevated) with two or more pillows. Avoid excessive neck movements. Activity Do not drive or use machinery if you feel dizzy. After doing the Epley maneuver, return to your normal activities as told by your health care provider. Ask your health care provider what activities are safe for you. General instructions Drink enough fluid to keep your urine pale yellow. Do not drink alcohol. Take over-the-counter and prescription medicines only as told by your health care provider. Keep all follow-up visits. This is important. Preventing vertigo symptoms Ask your health care provider if there is anything you should do at home to prevent vertigo. He or she may recommend that you: Keep your head elevated with two or more pillows while you sleep. Do not sleep on the side of your affected ear. Get  up slowly from bed. Avoid sudden movements during the day. Avoid extreme head positions or movement, such as looking up or bending over. Contact a health care provider if: Your vertigo gets worse. You have other symptoms, including: Nausea. Vomiting. Headache. Get help right away if you: Have vision changes. Have a headache or neck pain that is severe or getting worse. Cannot stop vomiting. Have new numbness or weakness in any part of your body. These symptoms may represent a serious  problem that is an emergency. Do not wait to see if the symptoms will go away. Get medical help right away. Call your local emergency services (911 in the [place] Do not drive yourself to the hospital. Summary Vertigo is the feeling that you or your surroundings are moving when they are not. The Epley maneuver is an exercise that relieves symptoms of vertigo. If the Epley maneuver is done correctly, it is considered safe. This information is not intended to replace advice given to you by your health care provider. Make sure you discuss any questions you have with your health care provider. Document Revised: 04/14/2020 Document Reviewed: 04/14/2020 Elsevier Patient Education  2024 [employer]     Self Treatment for Left Posterior / Anterior Canalithiasis    Sitting on bed: 1. Turn head 45 left. (a) Lie back slowly, shoulders on pillow, head on bed. (b) Hold __20-30__ seconds. 2. Keeping head on bed, turn head 90 right. Hold __20-30__ seconds. 3. Roll to right, head on 45 angle down toward bed. Hold __20-30__ seconds. 4. Sit up on right side of bed. Repeat __3__ times per session. Do _2__ sessions per day.  Copyright  VHI. All rights reserved.

## 2022-11-02 NOTE — Therapy (Signed)
OUTPATIENT PHYSICAL THERAPY VESTIBULAR EVALUATION     Patient Name: Keith Mcdonald MRN: [id number] DOB:11/13/1960, 62 y.o., male Today's Date: 11/03/2022  END OF SESSION:  PT End of Session - 11/03/22 1939     Visit Number 1    Number of Visits 5    Date for PT Re-Evaluation 12/08/22    Authorization Type [employer]    Authorization Time Period 11-02-22 - 12-27-22    PT Start Time 1402    PT Stop Time 1446    PT Time Calculation (min) 44 min    Activity Tolerance Patient tolerated treatment well    Behavior During Therapy [facility] for tasks assessed/performed             Past Medical History:  Diagnosis Date   Head injury    04/28/2019 -- 1st one - 15 yrs ago//2nd one - 12 yrs ago   Hyperlipidemia    Post concussion syndrome    Vertigo    Past Surgical History:  Procedure Laterality Date   KNEE SURGERY     Patient Active Problem List   Diagnosis Date Noted   Nonintractable headache 10/13/2019   Benign paroxysmal positional vertigo of right ear 04/28/2019   Primary osteoarthritis of left knee 03/14/2019   Effusion, left knee 03/14/2019   DDD (degenerative disc disease), cervical 07/12/2016    PCP: Deatra James, MD REFERRING PROVIDER: Deatra James, MD  REFERRING DIAG:  Diagnosis  H81.10 (ICD-10-CM) - Benign paroxysmal vertigo, unspecified ear    THERAPY DIAG:  BPPV (benign paroxysmal positional vertigo), left  ONSET DATE: 10-20-22  Rationale for Evaluation and Treatment: Rehabilitation  SUBJECTIVE:   SUBJECTIVE STATEMENT: Pt states he was asleep on morning of 10-20-22 and woke up with dizziness; no nausea or vomiting with this episode.  Pt states he is unable to look up because he will get very dizzy Pt accompanied by: wife  PERTINENT HISTORY: h/o TBI in 2008; was hit in head by plaster from a ceiling in 2021 with BPPV episode occurring soon after (Feb. 2021), cervical DDD  PAIN:  Are you having pain?  Feels light-headed, "foggy"  PRECAUTIONS:  None  WEIGHT BEARING RESTRICTIONS: No  FALLS: Has patient fallen in last 6 months? No N LIVING ENVIRONMENT: Lives with: lives with their family Lives in: House/apartment  PLOF: Independent  PATIENT GOALS: resolve the dizziness; be able to look up without having dizziness  OBJECTIVE:   DIAGNOSTIC FINDINGS: N/A   Cervical ROM:  WFL's  GAIT: Gait pattern: WFL Distance walked: 24' Assistive device utilized: None Level of assistance: Complete Independence  PATIENT SURVEYS:  FOTO DPS 43/100; risk adjusted 47/100  VESTIBULAR ASSESSMENT:  GENERAL OBSERVATION: 62 yr old gentleman amb. Independently without device   SYMPTOM BEHAVIOR:  Subjective history: episode started on 10-20-22 when he got up to go to bathroom  Non-Vestibular symptoms:  N/A  Type of dizziness: Spinning/Vertigo  Frequency: varies  Duration: seconds to minutes  Aggravating factors: Induced by position change: rolling to the left and looking up  Relieving factors: head stationary  Progression of symptoms:  less spinning but the light-headedness is still the same   POSITIONAL TESTING: Right Dix-Hallpike: no nystagmus Left Dix-Hallpike: no nystagmus and pt had subjective c/o dizziness   VESTIBULAR TREATMENT:  DATE: 11-02-22  Canalith Repositioning:  Epley Left: Number of Reps: 3, Response to Treatment: symptoms improved, and Comment: appeared to be fully resolved on 3rd rep  PATIENT EDUCATION: Education details: etiology of BPPV - article from [employer] given to pt.; instructions on how to perform Epley maneuver for self treatment prn Person educated: Patient and Spouse Education method: Explanation, Demonstration, and Handouts Education comprehension: verbalized understanding   How to Perform the Epley Maneuver The Epley maneuver is an exercise that relieves symptoms of vertigo. Vertigo is the feeling that you  or your surroundings are moving when they are not. When you feel vertigo, you may feel like the room is spinning and may have trouble walking. The Epley maneuver is used for a type of vertigo caused by a calcium deposit in a part of the inner ear. The maneuver involves changing head positions to help the deposit move out of the area. You can do this maneuver at home whenever you have symptoms of vertigo. You can repeat it in 24 hours if your vertigo has not gone away. Even though the Epley maneuver may relieve your vertigo for a few weeks, it is possible that your symptoms will return. This maneuver relieves vertigo, but it does not relieve dizziness. What are the risks? If it is done correctly, the Epley maneuver is considered safe. Sometimes it can lead to dizziness or nausea that goes away after a short time. If you develop other symptoms--such as changes in vision, weakness, or numbness--stop doing the maneuver and call your health care provider. Supplies needed: A bed or table. A pillow. How to do the Epley maneuver     Sit on the edge of a bed or table with your back straight and your legs extended or hanging over the edge of the bed or table. Turn your head halfway toward the affected ear or side as told by your health care provider. Lie backward quickly with your head turned until you are lying flat on your back. Your head should dangle (head-hanging position). You may want to position a pillow under your shoulders. Hold this position for at least 30 seconds. If you feel dizzy or have symptoms of vertigo, continue to hold the position until the symptoms stop. Turn your head to the opposite direction until your unaffected ear is facing down. Your head should continue to dangle. Hold this position for at least 30 seconds. If you feel dizzy or have symptoms of vertigo, continue to hold the position until the symptoms stop. Turn your whole body to the same side as your head so that you are  positioned on your side. Your head will now be nearly facedown and no longer needs to dangle. Hold for at least 30 seconds. If you feel dizzy or have symptoms of vertigo, continue to hold the position until the symptoms stop. Sit back up. You can repeat the maneuver in 24 hours if your vertigo does not go away. Follow these instructions at home: For 24 hours after doing the Epley maneuver: Keep your head in an upright position. When lying down to sleep or rest, keep your head raised (elevated) with two or more pillows. Avoid excessive neck movements. Activity Do not drive or use machinery if you feel dizzy. After doing the Epley maneuver, return to your normal activities as told by your health care provider. Ask your health care provider what activities are safe for you. General instructions Drink enough fluid to keep your urine pale yellow. Do not drink  alcohol. Take over-the-counter and prescription medicines only as told by your health care provider. Keep all follow-up visits. This is important. Preventing vertigo symptoms Ask your health care provider if there is anything you should do at home to prevent vertigo. He or she may recommend that you: Keep your head elevated with two or more pillows while you sleep. Do not sleep on the side of your affected ear. Get up slowly from bed. Avoid sudden movements during the day. Avoid extreme head positions or movement, such as looking up or bending over. Contact a health care provider if: Your vertigo gets worse. You have other symptoms, including: Nausea. Vomiting. Headache. Get help right away if you: Have vision changes. Have a headache or neck pain that is severe or getting worse. Cannot stop vomiting. Have new numbness or weakness in any part of your body. These symptoms may represent a serious problem that is an emergency. Do not wait to see if the symptoms will go away. Get medical help right away. Call your local emergency  services (911 in the [place] Do not drive yourself to the hospital. Summary Vertigo is the feeling that you or your surroundings are moving when they are not. The Epley maneuver is an exercise that relieves symptoms of vertigo. If the Epley maneuver is done correctly, it is considered safe. This information is not intended to replace advice given to you by your health care provider. Make sure you discuss any questions you have with your health care provider. Document Revised: 04/14/2020 Document Reviewed: 04/14/2020 Elsevier Patient Education  2024 Elsevier Inc.     Self Treatment for Left Posterior / Anterior Canalithiasis    Sitting on bed: 1. Turn head 45 left. (a) Lie back slowly, shoulders on pillow, head on bed. (b) Hold __20-30__ seconds. 2. Keeping head on bed, turn head 90 right. Hold __20-30__ seconds. 3. Roll to right, head on 45 angle down toward bed. Hold __20-30__ seconds. 4. Sit up on right side of bed. Repeat __3__ times per session. Do _2__ sessions per day.     HOME EXERCISE PROGRAM:  GOALS: Goals reviewed with patient? Yes  LONG TERM GOALS: Target date: 12-01-22  Pt will have (-) Lt Dix-Hallpike test with no nystagmus and no c/o vertigo to indicate resolution of Lt BPPV. Baseline:  Goal status: INITIAL  2.  Improve FOTO score to > 61/100 to demo improvement in vertigo and mobility. Baseline: 43/100 Goal status: INITIAL  3.  Verbalize understanding of Epley maneuver for self treatment prn for possible reoccurrence of BPPV.  Baseline:  Goal status: INITIAL  ASSESSMENT:  CLINICAL IMPRESSION: Patient is a 62 y.o. gentleman who was seen today for physical therapy evaluation and treatment for Lt BPPV.  Pt had no nystagmus with positional testing but subjectively reported dizziness in Lt Dix-Hallpike test position.  Pt was treated with 3 reps Epley maneuver and symptoms appeared to be fully resolved on 3rd rep with pt reporting feeling much better.  Pt will  benefit from PT to address Lt BPPV with c/o vertigo with positional changes.   OBJECTIVE IMPAIRMENTS: decreased balance and dizziness.   ACTIVITY LIMITATIONS: bending, bed mobility, reach over head, and locomotion level  PARTICIPATION LIMITATIONS: occupation  PERSONAL FACTORS:  N/A  are also affecting patient's functional outcome.   REHAB POTENTIAL: Good  CLINICAL DECISION MAKING: Stable/uncomplicated  EVALUATION COMPLEXITY: Low   PLAN:  PT FREQUENCY: 1x/week  PT DURATION: 4 weeks  PLANNED INTERVENTIONS: Therapeutic exercises, Therapeutic activity, Neuromuscular re-education, Balance training,  Gait training, Patient/Family education, Self Care, Vestibular training, and Canalith repositioning  PLAN FOR NEXT SESSION: recheck Lt BPPV and treat prn   Alta Shober, Donavan Burnet, PT 11/03/2022, 7:42 PM

## 2022-11-03 ENCOUNTER — Encounter: Payer: Self-pay | Admitting: Physical Therapy

## 2022-11-09 ENCOUNTER — Encounter: Payer: Self-pay | Admitting: Physical Therapy

## 2022-11-09 ENCOUNTER — Ambulatory Visit: Payer: Medicare HMO | Admitting: Physical Therapy

## 2022-11-09 DIAGNOSIS — H8112 Benign paroxysmal vertigo, left ear: Secondary | ICD-10-CM

## 2022-11-09 NOTE — Patient Instructions (Addendum)
Gaze Stabilization: Standing Feet Apart    Feet shoulder width apart, keeping eyes on target on wall __5-6__ feet away, tilt head down 15-30 and move head side to side  3__ sessions per day. Repeat using target on pattern background.   Gaze Stabilization: Tip Card  1.Target must remain in focus, not blurry, and appear stationary while head is in motion. 2.Perform exercises with small head movements (45 to either side of midline). 3.Increase speed of head motion so long as target is in focus. 4.If you wear eyeglasses, be sure you can see target through lens (therapist will give specific instructions for bifocal / progressive lenses). 5.These exercises may provoke dizziness or nausea. Work through these symptoms. If too dizzy, slow head movement slightly. Rest between each exercise. 6.Exercises demand concentration; avoid distractions. 7.For safety, perform standing exercises close to a counter, wall, corner, or next to someone.  Copyright  VHI. All rights reserved.

## 2022-11-09 NOTE — Therapy (Signed)
OUTPATIENT PHYSICAL THERAPY VESTIBULAR TREATMENT NOTE     Patient Name: Keith Mcdonald MRN: [id number] DOB:August 08, 1960, 62 y.o., male Today's Date: 11/09/2022  END OF SESSION:  PT End of Session - 11/09/22 1947     Visit Number 2    Number of Visits 5    Date for PT Re-Evaluation 12/08/22    Authorization Type [employer]    Authorization Time Period 11-02-22 - 12-27-22    Authorization - Visit Number 2    Authorization - Number of Visits 5    PT Start Time 0800    PT Stop Time 0844    PT Time Calculation (min) 44 min    Activity Tolerance Patient tolerated treatment well    Behavior During Therapy [facility] for tasks assessed/performed              Past Medical History:  Diagnosis Date   Head injury    04/28/2019 -- 1st one - 15 yrs ago//2nd one - 12 yrs ago   Hyperlipidemia    Post concussion syndrome    Vertigo    Past Surgical History:  Procedure Laterality Date   KNEE SURGERY     Patient Active Problem List   Diagnosis Date Noted   Nonintractable headache 10/13/2019   Benign paroxysmal positional vertigo of right ear 04/28/2019   Primary osteoarthritis of left knee 03/14/2019   Effusion, left knee 03/14/2019   DDD (degenerative disc disease), cervical 07/12/2016    PCP: Deatra James, MD REFERRING PROVIDER: Deatra James, MD  REFERRING DIAG:  Diagnosis  H81.10 (ICD-10-CM) - Benign paroxysmal vertigo, unspecified ear    THERAPY DIAG:  BPPV (benign paroxysmal positional vertigo), left  ONSET DATE: 10-20-22  Rationale for Evaluation and Treatment: Rehabilitation  SUBJECTIVE:   SUBJECTIVE STATEMENT: Pt reports he is doing much better - has not had any episodes of spinning vertigo - feels he is 90% back to normal:  still feels "fogginess" in his head at times Pt accompanied by: wife  PERTINENT HISTORY: h/o TBI in 2008; was hit in head by plaster from a ceiling in 2021 with BPPV episode occurring soon after (Feb. 2021), cervical DDD  PAIN:  Are you having  pain?  Feels light-headed, "foggy"  PRECAUTIONS: None  WEIGHT BEARING RESTRICTIONS: No  FALLS: Has patient fallen in last 6 months? No N LIVING ENVIRONMENT: Lives with: lives with their family Lives in: House/apartment  PLOF: Independent  PATIENT GOALS: resolve the dizziness; be able to look up without having dizziness  OBJECTIVE:   VESTIBULAR TREATMENT:                                                                                                   DATE: 11-09-22  Lt Dix- Hallpike test (-) with no nystagmus and no c/o spinning vertigo; performed 1 rep Epley for Lt BPPV for confirmation of resolution of Lt BPPV in all positions of maneuver- and also performed for review of correct positioning/technique for self treatment prn should pt have re-occurrence of BPPV; wife present for demonstration and verbalized understanding   Canalith Repositioning:  Epley Left: Number  of Reps: 1, Response to Treatment: symptoms improved, and Comment: NO nystagmus or c/o vertigo in any position of maneuver  NeuroRe-ed:  SVA  - line 9:  DVA line 7 (WNL's) but pt reported mild increase in dizziness upon completion of testing  mCTSIB: Condition 1 - 30 secs:  Condition 2 - 30 secs:  Condition 3 - 30 secs: Condition 4 - 30 secs with mild postural sway  PATIENT EDUCATION: Education details: x1 viewing with target provided Person educated: Patient and Spouse Education method: Explanation, Demonstration, and Handouts Education comprehension: verbalized understanding   Gaze Stabilization: Standing Feet Apart    Feet shoulder width apart, keeping eyes on target on wall __5-6__ feet away, tilt head down 15-30 and move head side to side  3__ sessions per day. Repeat using target on pattern background.   Gaze Stabilization: Tip Card  1.Target must remain in focus, not blurry, and appear stationary while head is in motion. 2.Perform exercises with small head movements (45 to either side of  midline). 3.Increase speed of head motion so long as target is in focus. 4.If you wear eyeglasses, be sure you can see target through lens (therapist will give specific instructions for bifocal / progressive lenses). 5.These exercises may provoke dizziness or nausea. Work through these symptoms. If too dizzy, slow head movement slightly. Rest between each exercise. 6.Exercises demand concentration; avoid distractions. 7.For safety, perform standing exercises close to a counter, wall, corner, or next to someone.  Copyright  VHI. All rights reserved.    How to Perform the Epley Maneuver The Epley maneuver is an exercise that relieves symptoms of vertigo. Vertigo is the feeling that you or your surroundings are moving when they are not. When you feel vertigo, you may feel like the room is spinning and may have trouble walking. The Epley maneuver is used for a type of vertigo caused by a calcium deposit in a part of the inner ear. The maneuver involves changing head positions to help the deposit move out of the area. You can do this maneuver at home whenever you have symptoms of vertigo. You can repeat it in 24 hours if your vertigo has not gone away. Even though the Epley maneuver may relieve your vertigo for a few weeks, it is possible that your symptoms will return. This maneuver relieves vertigo, but it does not relieve dizziness. What are the risks? If it is done correctly, the Epley maneuver is considered safe. Sometimes it can lead to dizziness or nausea that goes away after a short time. If you develop other symptoms--such as changes in vision, weakness, or numbness--stop doing the maneuver and call your health care provider. Supplies needed: A bed or table. A pillow. How to do the Epley maneuver     Sit on the edge of a bed or table with your back straight and your legs extended or hanging over the edge of the bed or table. Turn your head halfway toward the affected ear or side as told  by your health care provider. Lie backward quickly with your head turned until you are lying flat on your back. Your head should dangle (head-hanging position). You may want to position a pillow under your shoulders. Hold this position for at least 30 seconds. If you feel dizzy or have symptoms of vertigo, continue to hold the position until the symptoms stop. Turn your head to the opposite direction until your unaffected ear is facing down. Your head should continue to dangle. Hold this position  for at least 30 seconds. If you feel dizzy or have symptoms of vertigo, continue to hold the position until the symptoms stop. Turn your whole body to the same side as your head so that you are positioned on your side. Your head will now be nearly facedown and no longer needs to dangle. Hold for at least 30 seconds. If you feel dizzy or have symptoms of vertigo, continue to hold the position until the symptoms stop. Sit back up. You can repeat the maneuver in 24 hours if your vertigo does not go away. Follow these instructions at home: For 24 hours after doing the Epley maneuver: Keep your head in an upright position. When lying down to sleep or rest, keep your head raised (elevated) with two or more pillows. Avoid excessive neck movements. Activity Do not drive or use machinery if you feel dizzy. After doing the Epley maneuver, return to your normal activities as told by your health care provider. Ask your health care provider what activities are safe for you. General instructions Drink enough fluid to keep your urine pale yellow. Do not drink alcohol. Take over-the-counter and prescription medicines only as told by your health care provider. Keep all follow-up visits. This is important. Preventing vertigo symptoms Ask your health care provider if there is anything you should do at home to prevent vertigo. He or she may recommend that you: Keep your head elevated with two or more pillows while you  sleep. Do not sleep on the side of your affected ear. Get up slowly from bed. Avoid sudden movements during the day. Avoid extreme head positions or movement, such as looking up or bending over. Contact a health care provider if: Your vertigo gets worse. You have other symptoms, including: Nausea. Vomiting. Headache. Get help right away if you: Have vision changes. Have a headache or neck pain that is severe or getting worse. Cannot stop vomiting. Have new numbness or weakness in any part of your body. These symptoms may represent a serious problem that is an emergency. Do not wait to see if the symptoms will go away. Get medical help right away. Call your local emergency services (911 in the [place] Do not drive yourself to the hospital. Summary Vertigo is the feeling that you or your surroundings are moving when they are not. The Epley maneuver is an exercise that relieves symptoms of vertigo. If the Epley maneuver is done correctly, it is considered safe. This information is not intended to replace advice given to you by your health care provider. Make sure you discuss any questions you have with your health care provider. Document Revised: 04/14/2020 Document Reviewed: 04/14/2020 Elsevier Patient Education  2024 Elsevier Inc.     Self Treatment for Left Posterior / Anterior Canalithiasis    Sitting on bed: 1. Turn head 45 left. (a) Lie back slowly, shoulders on pillow, head on bed. (b) Hold __20-30__ seconds. 2. Keeping head on bed, turn head 90 right. Hold __20-30__ seconds. 3. Roll to right, head on 45 angle down toward bed. Hold __20-30__ seconds. 4. Sit up on right side of bed. Repeat __3__ times per session. Do _2__ sessions per day.     HOME EXERCISE PROGRAM:  GOALS: Goals reviewed with patient? Yes  LONG TERM GOALS: Target date: 12-01-22  Pt will have (-) Lt Dix-Hallpike test with no nystagmus and no c/o vertigo to indicate resolution of Lt BPPV. Baseline:   Goal status: Goal met 11-09-22  2.  Improve FOTO score to >  61/100 to demo improvement in vertigo and mobility. Baseline: 43/100 Goal status: INITIAL  3.  Verbalize understanding of Epley maneuver for self treatment prn for possible reoccurrence of BPPV.  Baseline:  Goal status: Goal met 11-09-22  ASSESSMENT:  CLINICAL IMPRESSION: Patient had (-) Lt Dix-Hallpike test with no nystagmus and no c/o vertigo: Epley performed for confirmation of resolution of Lt BPPV and pt reported no dizziness in any position.  Pt and wife verbalized understanding of maneuver for self treatment prn.  Pt's DVA WNL's with a 2 line difference, but pt did report mild increase in dizziness upon completion of test so x1 viewing exercise was issued for HEP.  Pt requested to be placed on hold to allow follow up visit if needed should BPPV re-occur in next 30 days.  Will discharge at that time if pt has not returned to PT -plan to follow up via phone to check pt's status.    OBJECTIVE IMPAIRMENTS: decreased balance and dizziness.   ACTIVITY LIMITATIONS: bending, bed mobility, reach over head, and locomotion level  PARTICIPATION LIMITATIONS: occupation  PERSONAL FACTORS:  N/A  are also affecting patient's functional outcome.   REHAB POTENTIAL: Good  CLINICAL DECISION MAKING: Stable/uncomplicated  EVALUATION COMPLEXITY: Low   PLAN:  PT FREQUENCY: 1x/week  PT DURATION: 4 weeks  PLANNED INTERVENTIONS: Therapeutic exercises, Therapeutic activity, Neuromuscular re-education, Balance training, Gait training, Patient/Family education, Self Care, Vestibular training, and Canalith repositioning  PLAN FOR NEXT SESSION: Place on HOLD for 30 days - pt to call for appt if needed - will contact pt via phone if no appt scheduled and will D/C at that time. FOTO to be completed at that time.   Kary Kos, PT 11/09/2022, 7:49 PM

## 2022-11-27 ENCOUNTER — Emergency Department (HOSPITAL_COMMUNITY)
Admission: EM | Admit: 2022-11-27 | Discharge: 2022-11-27 | Disposition: A | Discharge status: Home / Self Care | Payer: Medicare HMO | Attending: Emergency Medicine | Admitting: Emergency Medicine

## 2022-11-27 ENCOUNTER — Emergency Department (HOSPITAL_COMMUNITY): Payer: Medicare HMO

## 2022-11-27 DIAGNOSIS — S61311A Laceration without foreign body of left index finger with damage to nail, initial encounter: Secondary | ICD-10-CM | POA: Insufficient documentation

## 2022-11-27 DIAGNOSIS — F17210 Nicotine dependence, cigarettes, uncomplicated: Secondary | ICD-10-CM | POA: Insufficient documentation

## 2022-11-27 DIAGNOSIS — S6992XA Unspecified injury of left wrist, hand and finger(s), initial encounter: Secondary | ICD-10-CM | POA: Diagnosis not present

## 2022-11-27 DIAGNOSIS — S61211A Laceration without foreign body of left index finger without damage to nail, initial encounter: Secondary | ICD-10-CM | POA: Diagnosis not present

## 2022-11-27 DIAGNOSIS — W298XXA Contact with other powered powered hand tools and household machinery, initial encounter: Secondary | ICD-10-CM | POA: Diagnosis not present

## 2022-11-27 DIAGNOSIS — M19042 Primary osteoarthritis, left hand: Secondary | ICD-10-CM | POA: Diagnosis not present

## 2022-11-27 MED ORDER — HYDROCODONE-ACETAMINOPHEN 5-325 MG PO TABS: 1.0000 | ORAL_TABLET | Freq: Four times a day (QID) | ORAL | 0 refills | Status: AC | PRN

## 2022-11-27 MED ORDER — OXYCODONE-ACETAMINOPHEN 5-325 MG PO TABS
1.0000 | ORAL_TABLET | Freq: Once | ORAL | Status: AC
  Administered 2022-11-27: 1 via ORAL
  Filled 2022-11-27: qty 1

## 2022-11-27 MED ORDER — IBUPROFEN 600 MG PO TABS: 600.0000 mg | ORAL_TABLET | Freq: Four times a day (QID) | ORAL | 0 refills | Status: AC | PRN

## 2022-11-27 NOTE — Discharge Instructions (Signed)
We evaluated you for your index finger laceration.  We cleaned out your wound and obtained an x-ray.  We did not see any sign of injury to the bone.  Based on the location of your injury, we decided not to suture your wound.  I believe that your nailbed will heal and your nail will grow over your wound.  Please keep your finger clean and dry.  We have given you wound care supplies.  Please take 600 mg of ibuprofen every 6 hours as needed for pain.  If this does not help you can take a Norco.  Please take this only if necessary.  Do not drink alcohol with this medication or drive while taking the Norco.  Please follow-up with your primary care doctor for wound check.  If you would like to follow-up with a hand specialist, you can follow-up with Dr. Eulah Pont.  Please keep an eye on your wound.  If you develop any redness, drainage of pus, increasing swelling, increasing pain, or any other new symptoms, please return to the emergency department for reassessment.

## 2022-11-27 NOTE — ED Triage Notes (Signed)
Pt to ED c/o left index finger laceration, reports cut it on a power tool aprox 30 mins ago. Currently bandaged, bleeding controlled. Pt able to move finger.

## 2022-11-27 NOTE — ED Provider Notes (Signed)
[place] EMERGENCY DEPARTMENT AT [facility] Provider Note  CSN: [id number] Arrival date & time: 11/27/22 1549  Chief Complaint(s) Finger Injury (Left index)  HPI Keith Mcdonald is a 62 y.o. male with history of hyperlipidemia presenting to the emergency department with left index finger laceration.  He was using an angle grinder.  Reports that he has pain at the area.  This occurred today.  No other injuries.  Reports his tetanus is up-to-date.   Past Medical History Past Medical History:  Diagnosis Date   Head injury    04/28/2019 -- 1st one - 15 yrs ago//2nd one - 12 yrs ago   Hyperlipidemia    Post concussion syndrome    Vertigo    Patient Active Problem List   Diagnosis Date Noted   Nonintractable headache 10/13/2019   Benign paroxysmal positional vertigo of right ear 04/28/2019   Primary osteoarthritis of left knee 03/14/2019   Effusion, left knee 03/14/2019   DDD (degenerative disc disease), cervical 07/12/2016   Home Medication(s) Prior to Admission medications   Medication Sig Start Date End Date Taking? Authorizing Provider  HYDROcodone-acetaminophen (NORCO/VICODIN) 5-325 MG tablet Take 1 tablet by mouth every 6 (six) hours as needed. 11/27/22  Yes Lonell Grandchild, MD  ibuprofen (ADVIL) 600 MG tablet Take 1 tablet (600 mg total) by mouth every 6 (six) hours as needed. 11/27/22  Yes Lonell Grandchild, MD  albuterol (VENTOLIN HFA) 108 (90 Base) MCG/ACT inhaler Inhale 1-2 puffs into the lungs every 6 (six) hours as needed for wheezing or shortness of breath. 04/16/21   Gustavus Bryant, FNP  COVID-19 mRNA Vac-TriS, Pfizer, (PFIZER-BIONT COVID-19 VAC-TRIS) SUSP injection Inject into the muscle. 01/17/21   Judyann Munson, MD  guaiFENesin 200 MG tablet Take 1 tablet (200 mg total) by mouth every 4 (four) hours as needed for cough or to loosen phlegm. 04/16/21   Gustavus Bryant, FNP  meloxicam (MOBIC) 15 MG tablet Take 1 tablet (15 mg total) by mouth daily. 07/26/22    Huel Cote, MD  rosuvastatin (CRESTOR) 10 MG tablet Take 10 mg by mouth daily. 10/22/21   [provider]                                                                                                                                    Past Surgical History Past Surgical History:  Procedure Laterality Date   KNEE SURGERY     Family History Family History  Problem Relation Age of Onset   Hypertension Mother    Hypertension Father    Hypertension Sister     Social History Social History   Tobacco Use   Smoking status: Every Day    Packs/day: .25    Types: Cigarettes   Smokeless tobacco: Never  Vaping Use   Vaping Use: Never used  Substance Use Topics   Alcohol use: No   Drug use: No  Types: "Crack" cocaine   Allergies Bee venom  Review of Systems Review of Systems  All other systems reviewed and are negative.   Physical Exam Vital Signs  I have reviewed the triage vital signs BP (!) 166/87 (BP Location: Right Arm)   Pulse 61   Temp 98.8 F (37.1 C) (Oral)   Resp 16   Ht 6' (1.829 m)   Wt 111.1 kg   SpO2 100%   BMI 33.23 kg/m  Physical Exam Vitals and nursing note reviewed.  Constitutional:      General: He is not in acute distress.    Appearance: Normal appearance.  HENT:     Head: Normocephalic and atraumatic.     Mouth/Throat:     Mouth: Mucous membranes are moist.  Eyes:     Conjunctiva/sclera: Conjunctivae normal.  Cardiovascular:     Rate and Rhythm: Normal rate.  Pulmonary:     Effort: Pulmonary effort is normal. No respiratory distress.  Abdominal:     General: Abdomen is flat.  Musculoskeletal:     Comments: Left index finger with distal 1/4 of the nail with laceration extending superficially into the nailbed.  No subungual hematoma.  No visualized foreign body or foreign material.  Skin:    General: Skin is warm and dry.     Capillary Refill: Capillary refill takes less than 2 seconds.  Neurological:     General: No  focal deficit present.     Mental Status: He is alert. Mental status is at baseline.  Psychiatric:        Mood and Affect: Mood normal.        Behavior: Behavior normal.     ED Results and Treatments Labs (all labs ordered are listed, but only abnormal results are displayed) Labs Reviewed - No data to display                                                                                                                        Radiology DG Finger Index Left  Result Date: 11/27/2022 CLINICAL DATA:  Index finger injury. Power tool laceration. Bleeding controlled. EXAM: LEFT INDEX FINGER 2+V COMPARISON:  Hand radiographs 09/22/2013. FINDINGS: Distal soft tissue injury with multiple radiodensities in the soft tissues which may relate to overlying bandages. No definite tuftal erosion, acute fracture or dislocation. There are degenerative changes of the interphalangeal and metacarpal phalangeal joints which are similar to the prior radiographs. IMPRESSION: Distal soft tissue injury with multiple radiodensities in the soft tissues which may relate to overlying bandages. No definite acute fracture. Electronically Signed   By: Carey Bullocks M.D.   On: 11/27/2022 17:21    Pertinent labs & imaging results that were available during my care of the patient were reviewed by me and considered in my medical decision making (see MDM for details).  Medications Ordered in ED Medications  oxyCODONE-acetaminophen (PERCOCET/ROXICET) 5-325 MG per tablet 1 tablet (1 tablet Oral Given 11/27/22 2110)  Procedures Procedures  (including critical care time)  Medical Decision Making / ED Course   MDM:  16 presenting to the[age]year-old emergency department with left index finger issue.  On exam, patient has a laceration to the distal left index finger.  It extends through the nail and  superficially into the nailbed.  I do not think there would be any benefit to removing nail and suturing superficial nailbed laceration.  Wound was thoroughly irrigated and no foreign bodies were observed.  Discussed possibility of occult foreign body.  X-ray of the finger without evidence of bony injury, commented on possibility of foreign material but more likely related to bandage and I do not see any foreign body material in the location of the patient's laceration which is actually quite superficial.  I we will give patient wound care supplies including Xeroform gauze, discussed self-care, advise follow-up with primary care doctor versus hand surgery.  Discussed pain control with Motrin, give short course of Norco. [place] Controlled Substance Reporting System database was reviewed. and patient was instructed, not to drive, operate heavy machinery, perform activities at heights, swimming or participation in water activities or provide baby-sitting services while on Pain, Sleep and Anxiety Medications; until their outpatient Physician has advised to do so again. Also recommended to not to take more than prescribed Pain, Sleep and Anxiety Medications.   Will discharge patient to home. All questions answered. Patient comfortable with plan of discharge. Return precautions discussed with patient and specified on the after visit summary.       Additional history obtained: -Additional history obtained from spouse   Imaging Studies ordered: I ordered imaging studies including XR finger On my interpretation imaging demonstrates no fracture I independently visualized and interpreted imaging. I agree with the radiologist interpretation   Medicines ordered and prescription drug management: Meds ordered this encounter  Medications   oxyCODONE-acetaminophen (PERCOCET/ROXICET) 5-325 MG per tablet 1 tablet   HYDROcodone-acetaminophen (NORCO/VICODIN) 5-325 MG tablet    Sig: Take 1 tablet by  mouth every 6 (six) hours as needed.    Dispense:  6 tablet    Refill:  0   ibuprofen (ADVIL) 600 MG tablet    Sig: Take 1 tablet (600 mg total) by mouth every 6 (six) hours as needed.    Dispense:  30 tablet    Refill:  0    -I have reviewed the patients home medicines and have made adjustments as needed  Social Determinants of Health:  Diagnosis or treatment significantly limited by social determinants of health: obesity   Reevaluation: After the interventions noted above, I reevaluated the patient and found that their symptoms have improved  Co morbidities that complicate the patient evaluation  Past Medical History:  Diagnosis Date   Head injury    04/28/2019 -- 1st one - 15 yrs ago//2nd one - 12 yrs ago   Hyperlipidemia    Post concussion syndrome    Vertigo       Dispostion: Disposition decision including need for hospitalization was considered, and patient discharged from emergency department.    Final Clinical Impression(s) / ED Diagnoses Final diagnoses:  Laceration of left index finger without foreign body with damage to nail, initial encounter     This chart was dictated using voice recognition software.  Despite best efforts to proofread,  errors can occur which can change the documentation meaning.    Lonell Grandchild, MD 11/27/22 2152

## 2022-12-06 ENCOUNTER — Ambulatory Visit: Payer: Self-pay

## 2022-12-06 NOTE — Patient Outreach (Signed)
  Care Coordination   12/06/2022 Name: Keith Mcdonald MRN: [id number] DOB: [birth year]   Care Coordination Outreach Attempts:  An unsuccessful telephone outreach was attempted today to offer the patient information about available care coordination services.  Follow Up Plan:  Additional outreach attempts will be made to offer the patient care coordination information and services.   Encounter Outcome:  Pt. Request to Call Back   Care Coordination Interventions:  No, not indicated    Bary Leriche, RN, MSN [facility] Care Management Care Management Coordinator Direct Line [phone]

## 2022-12-12 ENCOUNTER — Telehealth: Payer: Self-pay

## 2022-12-12 NOTE — Patient Outreach (Signed)
  Care Coordination   Follow Up Visit Note   12/12/2022 Name: SASCHA BAUGHER MRN: [id number] DOB: 11-26-1960  SEBASTIN PERLMUTTER is a 62 y.o. year old male who sees Deatra James, MD for primary care. I spoke with  Vic Ripper by phone today.  What matters to the patients health and wellness today?  Lowering cholesterol    Goals Addressed             This Visit's Progress    Management Hyperlipidemia       Care Coordination Interventions: Provider established cholesterol goals reviewed Counseled on importance of regular laboratory monitoring as prescribed Reviewed importance of limiting foods high in cholesterol Patient reports he has been taking his medications regularly.     Patient doing well. Last total cholesterol 239.  Discussed reducing fat in his diet such as fried food, creams and sauces.  He verbalized understanding.  No concerns.           SDOH assessments and interventions completed:  Yes     Care Coordination Interventions:  Yes, provided   Follow up plan: Follow up call scheduled for October    Encounter Outcome:  Pt. Visit Completed   Bary Leriche, RN, MSN [facility] Care Management Care Management Coordinator Direct Line [phone]

## 2022-12-12 NOTE — Patient Instructions (Signed)
Visit Information  Thank you for taking time to visit with me today. Please don't hesitate to contact me if I can be of assistance to you.   Following are the goals we discussed today:   Goals Addressed             This Visit's Progress    Management Hyperlipidemia       Care Coordination Interventions: Provider established cholesterol goals reviewed Counseled on importance of regular laboratory monitoring as prescribed Reviewed importance of limiting foods high in cholesterol Patient reports he has been taking his medications regularly.     Patient doing well. Last total cholesterol 239.  Discussed reducing fat in his diet such as fried food, creams and sauces.  He verbalized understanding.  No concerns.           Our next appointment is by telephone on 03/12/23 at 0930 am  Please call the care guide team at [phone] if you need to cancel or reschedule your appointment.   If you are experiencing a Mental Health or Behavioral Health Crisis or need someone to talk to, please call the Suicide and Crisis Lifeline: 988   Patient verbalizes understanding of instructions and care plan provided today and agrees to view in MyChart. Active MyChart status and patient understanding of how to access instructions and care plan via MyChart confirmed with patient.     The patient has been provided with contact information for the care management team and has been advised to call with any health related questions or concerns.   Bary Leriche, RN, MSN [facility] Care Management Care Management Coordinator Direct Line [phone]

## 2023-01-25 ENCOUNTER — Institutional Professional Consult (permissible substitution): Payer: Medicare HMO | Admitting: Neurology

## 2023-02-23 DIAGNOSIS — F17210 Nicotine dependence, cigarettes, uncomplicated: Secondary | ICD-10-CM | POA: Diagnosis not present

## 2023-02-23 DIAGNOSIS — E785 Hyperlipidemia, unspecified: Secondary | ICD-10-CM | POA: Diagnosis not present

## 2023-03-12 ENCOUNTER — Ambulatory Visit: Payer: Self-pay

## 2023-03-12 NOTE — Patient Outreach (Signed)
  Care Coordination   Follow Up Visit Note   03/12/2023 Name: ANTWANN PREZIOSI MRN: [id number] DOB: [birth year]  RAWSON MINIX is a 62 y.o. year old male who sees Deatra James, MD for primary care. I spoke with  Vic Ripper by phone today.  What matters to the patients health and wellness today?  none    Goals Addressed             This Visit's Progress    Management Hyperlipidemia       Care Coordination Interventions: Provider established cholesterol goals reviewed Counseled on importance of regular laboratory monitoring as prescribed Reviewed importance of limiting foods high in cholesterol Patient reports he has been taking his medications regularly.     Patient doing well. Last total cholesterol 250.  Reviewed reducing fat in his diet such as fried food, creams and sauces.  He verbalized understanding.  No concerns.           SDOH assessments and interventions completed:  Yes  SDOH Interventions Today    Flowsheet Row Most Recent Value  SDOH Interventions   Food Insecurity Interventions Intervention Not Indicated  Health Literacy Interventions Intervention Not Indicated        Care Coordination Interventions:  Yes, provided   Follow up plan: Follow up call scheduled for January    Encounter Outcome:  Patient Visit Completed   Bary Leriche, RN, MSN [place]  [facility], [facility] Management Community Coordinator Direct Dial: [phone]  Fax: [phone] Website: [email or web address]

## 2023-03-12 NOTE — Patient Instructions (Signed)
Visit Information  Thank you for taking time to visit with me today. Please don't hesitate to contact me if I can be of assistance to you.   Following are the goals we discussed today:   Goals Addressed             This Visit's Progress    Management Hyperlipidemia       Care Coordination Interventions: Provider established cholesterol goals reviewed Counseled on importance of regular laboratory monitoring as prescribed Reviewed importance of limiting foods high in cholesterol Patient reports he has been taking his medications regularly.     Patient doing well. Last total cholesterol 250.  Reviewed reducing fat in his diet such as fried food, creams and sauces.  He verbalized understanding.  No concerns.           Our next appointment is by telephone on 06/12/22 at 0930  Please call the care guide team at [phone] if you need to cancel or reschedule your appointment.   If you are experiencing a Mental Health or Behavioral Health Crisis or need someone to talk to, please call the Suicide and Crisis Lifeline: 988   Patient verbalizes understanding of instructions and care plan provided today and agrees to view in MyChart. Active MyChart status and patient understanding of how to access instructions and care plan via MyChart confirmed with patient.     The patient has been provided with contact information for the care management team and has been advised to call with any health related questions or concerns.   Bary Leriche, RN, MSN [facility], [facility] Management Community Coordinator Direct Dial: [phone]  Fax: [phone] Website: [email or web address]

## 2023-06-11 ENCOUNTER — Ambulatory Visit: Payer: Self-pay

## 2023-06-11 NOTE — Patient Outreach (Signed)
  Care Coordination   Follow Up Visit Note   06/11/2023 Name: Keith Mcdonald MRN: [id number] DOB: 1960/06/08  Keith Mcdonald is a 63 y.o. year old male who sees Sun, Vyvyan, MD for primary care. I spoke with  Keith Mcdonald by phone today.  What matters to the patients health and wellness today?  Maintain health    Goals Addressed             This Visit's Progress    COMPLETED: Management Hyperlipidemia       Care Coordination Interventions: Provider established cholesterol goals reviewed Counseled on importance of regular laboratory monitoring as prescribed Reviewed importance of limiting foods high in cholesterol Patient reports he has been taking his medications regularly.     Patient doing well. Continues to remain active.  Reiterated reducing fat in his diet such as fried food, creams and sauces.  He verbalized understanding.  No concerns. RN CM closing case as patient continues to meet goals.         SDOH assessments and interventions completed:  Yes  SDOH Interventions Today    Flowsheet Row Most Recent Value  SDOH Interventions   Food Insecurity Interventions Intervention Not Indicated  Housing Interventions Intervention Not Indicated  Utilities Interventions Intervention Not Indicated  Health Literacy Interventions Intervention Not Indicated        Care Coordination Interventions:  Yes, provided   Follow up plan: No further intervention required.   Encounter Outcome:  Patient Visit Completed   Keith Biederman J Christeena Krogh, RN, MSN RN Care Manager [facility], Population Health Direct Dial: [phone]  Fax: [phone] Website: [email or web address]

## 2023-06-11 NOTE — Patient Instructions (Signed)
 Visit Information  Thank you for taking time to visit with me today. Please don't hesitate to contact me if I can be of assistance to you.   Following are the goals we discussed today:   Goals Addressed             This Visit's Progress    COMPLETED: Management Hyperlipidemia       Care Coordination Interventions: Provider established cholesterol goals reviewed Counseled on importance of regular laboratory monitoring as prescribed Reviewed importance of limiting foods high in cholesterol Patient reports he has been taking his medications regularly.     Patient doing well. Continues to remain active.  Reiterated reducing fat in his diet such as fried food, creams and sauces.  He verbalized understanding.  No concerns. RN CM closing case as patient continues to meet goals.          If you are experiencing a Mental Health or Behavioral Health Crisis or need someone to talk to, please call the Suicide and Crisis Lifeline: 988   Patient verbalizes understanding of instructions and care plan provided today and agrees to view in MyChart. Active MyChart status and patient understanding of how to access instructions and care plan via MyChart confirmed with patient.     The patient has been provided with contact information for the care management team and has been advised to call with any health related questions or concerns.   Samaiyah Howes J Devine Klingel, RN, MSN RN Care Manager [facility], Population Health Direct Dial: [phone]  Fax: [phone] Website: [email or web address]

## 2023-09-05 DIAGNOSIS — Z87820 Personal history of traumatic brain injury: Secondary | ICD-10-CM | POA: Diagnosis not present

## 2023-09-05 DIAGNOSIS — E785 Hyperlipidemia, unspecified: Secondary | ICD-10-CM | POA: Diagnosis not present

## 2023-09-05 DIAGNOSIS — Z0000 Encounter for general adult medical examination without abnormal findings: Secondary | ICD-10-CM | POA: Diagnosis not present

## 2023-09-05 DIAGNOSIS — Z23 Encounter for immunization: Secondary | ICD-10-CM | POA: Diagnosis not present

## 2023-09-05 DIAGNOSIS — F17210 Nicotine dependence, cigarettes, uncomplicated: Secondary | ICD-10-CM | POA: Diagnosis not present

## 2023-11-16 DIAGNOSIS — L255 Unspecified contact dermatitis due to plants, except food: Secondary | ICD-10-CM | POA: Diagnosis not present

## 2024-01-29 DIAGNOSIS — F17210 Nicotine dependence, cigarettes, uncomplicated: Secondary | ICD-10-CM | POA: Diagnosis not present

## 2024-02-27 ENCOUNTER — Emergency Department (HOSPITAL_BASED_OUTPATIENT_CLINIC_OR_DEPARTMENT_OTHER)
Admission: EM | Admit: 2024-02-27 | Discharge: 2024-02-27 | Disposition: A | Discharge status: Home / Self Care | Attending: Emergency Medicine | Admitting: Emergency Medicine

## 2024-02-27 ENCOUNTER — Encounter (HOSPITAL_BASED_OUTPATIENT_CLINIC_OR_DEPARTMENT_OTHER): Payer: Self-pay

## 2024-02-27 ENCOUNTER — Emergency Department (HOSPITAL_BASED_OUTPATIENT_CLINIC_OR_DEPARTMENT_OTHER)

## 2024-02-27 DIAGNOSIS — J40 Bronchitis, not specified as acute or chronic: Secondary | ICD-10-CM | POA: Insufficient documentation

## 2024-02-27 DIAGNOSIS — R0602 Shortness of breath: Secondary | ICD-10-CM | POA: Diagnosis not present

## 2024-02-27 DIAGNOSIS — F17200 Nicotine dependence, unspecified, uncomplicated: Secondary | ICD-10-CM | POA: Diagnosis not present

## 2024-02-27 LAB — CBC WITH DIFFERENTIAL/PLATELET
Abs Immature Granulocytes: 0.05 K/uL (ref 0.00–0.07)
Basophils Absolute: 0.1 K/uL (ref 0.0–0.1)
Basophils Relative: 1 %
Eosinophils Absolute: 0.3 K/uL (ref 0.0–0.5)
Eosinophils Relative: 4 %
HCT: 41.7 % (ref 39.0–52.0)
Hemoglobin: 14.3 g/dL (ref 13.0–17.0)
Immature Granulocytes: 1 %
Lymphocytes Relative: 52 %
Lymphs Abs: 3.8 K/uL (ref 0.7–4.0)
MCH: 30.2 pg (ref 26.0–34.0)
MCHC: 34.3 g/dL (ref 30.0–36.0)
MCV: 88.2 fL (ref 80.0–100.0)
Monocytes Absolute: 0.5 K/uL (ref 0.1–1.0)
Monocytes Relative: 7 %
Neutro Abs: 2.5 K/uL (ref 1.7–7.7)
Neutrophils Relative %: 35 %
Platelets: 290 K/uL (ref 150–400)
RBC: 4.73 MIL/uL (ref 4.22–5.81)
RDW: 14.3 % (ref 11.5–15.5)
WBC: 7.1 K/uL (ref 4.0–10.5)
nRBC: 0 % (ref 0.0–0.2)

## 2024-02-27 LAB — BASIC METABOLIC PANEL WITH GFR
Anion gap: 13 (ref 5–15)
BUN: 18 mg/dL (ref 8–23)
CO2: 22 mmol/L (ref 22–32)
Calcium: 9.2 mg/dL (ref 8.9–10.3)
Chloride: 104 mmol/L (ref 98–111)
Creatinine, Ser: 0.84 mg/dL (ref 0.61–1.24)
GFR, Estimated: >60 mL/min (ref >60)
Glucose, Bld: 95 mg/dL (ref 70–99)
Potassium: 3.8 mmol/L (ref 3.5–5.1)
Sodium: 138 mmol/L (ref 135–145)

## 2024-02-27 MED ORDER — ALBUTEROL SULFATE HFA 108 (90 BASE) MCG/ACT IN AERS: 2.0000 | INHALATION_SPRAY | Freq: Four times a day (QID) | RESPIRATORY_TRACT | 0 refills | Status: AC | PRN

## 2024-02-27 MED ORDER — ALBUTEROL SULFATE HFA 108 (90 BASE) MCG/ACT IN AERS
2.0000 | INHALATION_SPRAY | Freq: Once | RESPIRATORY_TRACT | Status: AC
  Administered 2024-02-27: 2 via RESPIRATORY_TRACT
  Filled 2024-02-27: qty 6.7

## 2024-02-27 MED ORDER — DEXAMETHASONE 4 MG PO TABS
10.0000 mg | ORAL_TABLET | Freq: Once | ORAL | Status: AC
  Administered 2024-02-27: 10 mg via ORAL
  Filled 2024-02-27: qty 3

## 2024-02-27 NOTE — ED Notes (Signed)
 Pulse oximetry conducted during ambulation; patient tolerated well; HR 89-95 bpm; O2 Sat 98-100% room air

## 2024-02-27 NOTE — Discharge Instructions (Addendum)
 You were seen today with concern for ongoing fatigue, cough, shortness of breath.  This was following COVID-19 infection.  You likely have some ongoing bronchitis that is worsened by your smoking.  Use inhaler as needed.  Your work appears reassuring without evidence of pneumonia.  Follow-up closely with your primary doctor.

## 2024-02-27 NOTE — ED Triage Notes (Signed)
 Patient reports approximately one month ago he had COVID, since then he has tested negative.  However he still has episodes of shortness of breath, wheezing, difficulty sleeping, and decreased endurance.

## 2024-02-27 NOTE — ED Provider Notes (Signed)
 [facility] EMERGENCY DEPARTMENT AT [facility] Provider Note   CSN: [id number] Arrival date & time: 02/27/24  0446     Patient presents with: Shortness of Breath   Keith Mcdonald is a 63 y.o. male.   HPI     This is a 63 year old male who presents with concerns for shortness of breath, fatigue, cough.  States he was diagnosed with COVID-19 1 month ago.  Since that time he has not felt well and has felt generally fatigued and wheezy.  He has difficulty sleeping.  He woke up tonight feeling like he was wheezing.  Reports a productive cough.  States that he buried a friend last week who died of COVID-pneumonia.  He has not had any ongoing fevers.  He is a current smoker.  Prior to Admission medications   Medication Sig Start Date End Date Taking? Authorizing Provider  albuterol  (VENTOLIN  HFA) 108 (90 Base) MCG/ACT inhaler Inhale 2 puffs into the lungs every 6 (six) hours as needed for wheezing or shortness of breath. 02/27/24  Yes Oddie Bottger, Charmaine FALCON, MD  albuterol  (VENTOLIN  HFA) 108 (90 Base) MCG/ACT inhaler Inhale 1-2 puffs into the lungs every 6 (six) hours as needed for wheezing or shortness of breath. 04/16/21   Hazen Darryle BRAVO, FNP  COVID-19 mRNA Vac-TriS, Pfizer, (PFIZER-BIONT COVID-19 VAC-TRIS) SUSP injection Inject into the muscle. 01/17/21   Luiz Channel, MD  guaiFENesin  200 MG tablet Take 1 tablet (200 mg total) by mouth every 4 (four) hours as needed for cough or to loosen phlegm. 04/16/21   Hazen Darryle BRAVO, FNP  HYDROcodone -acetaminophen  (NORCO/VICODIN) 5-325 MG tablet Take 1 tablet by mouth every 6 (six) hours as needed. 11/27/22   Francesca Elsie CROME, MD  ibuprofen  (ADVIL ) 600 MG tablet Take 1 tablet (600 mg total) by mouth every 6 (six) hours as needed. 11/27/22   Francesca Elsie CROME, MD  meloxicam  (MOBIC ) 15 MG tablet Take 1 tablet (15 mg total) by mouth daily. 07/26/22   Genelle Standing, MD  rosuvastatin (CRESTOR) 10 MG tablet Take 10 mg by mouth daily. 10/22/21   [provider]    Allergies: Bee venom    Review of Systems  Constitutional:  Negative for fever.  Respiratory:  Positive for cough and shortness of breath.   Cardiovascular:  Negative for chest pain.  Gastrointestinal:  Negative for abdominal pain.  All other systems reviewed and are negative.   Updated Vital Signs BP (!) 162/89   Pulse 66   Temp 98.8 F (37.1 C) (Oral)   Resp 12   Ht 1.829 m (6')   Wt 120.2 kg   SpO2 98%   BMI 35.94 kg/m   Physical Exam Vitals and nursing note reviewed.  Constitutional:      Appearance: He is well-developed. He is not ill-appearing.  HENT:     Head: Normocephalic and atraumatic.  Eyes:     Pupils: Pupils are equal, round, and reactive to light.  Cardiovascular:     Rate and Rhythm: Normal rate and regular rhythm.     Heart sounds: Normal heart sounds. No murmur heard. Pulmonary:     Effort: Pulmonary effort is normal. No respiratory distress.     Breath sounds: Normal breath sounds. No wheezing.  Abdominal:     General: Bowel sounds are normal.     Palpations: Abdomen is soft.     Tenderness: There is no abdominal tenderness. There is no rebound.  Musculoskeletal:     Cervical back: Neck supple.  Lymphadenopathy:     Cervical: No cervical adenopathy.  Skin:    General: Skin is warm and dry.  Neurological:     Mental Status: He is alert and oriented to person, place, and time.  Psychiatric:        Mood and Affect: Mood normal.     (all labs ordered are listed, but only abnormal results are displayed) Labs Reviewed  CBC WITH DIFFERENTIAL/PLATELET  BASIC METABOLIC PANEL WITH GFR    EKG: None  Radiology: DG Chest Portable 1 View Result Date: 02/27/2024 CLINICAL DATA:  63 year old male with shortness of breath since illness 1 month ago with COVID-19. EXAM: PORTABLE CHEST 1 VIEW COMPARISON:  Chest radiographs 01/06/2018 and earlier. FINDINGS: Portable AP view at 0519 hours. Lung volumes are stable, low normal. Normal  cardiac size and mediastinal contours. Visualized tracheal air column is within normal limits. Lung markings appear stable. Allowing for portable technique the lungs are clear. No pneumothorax or pleural effusion. No acute osseous abnormality identified. Negative visible bowel gas. IMPRESSION: Negative portable chest. Electronically Signed   By: VEAR Hurst M.D.   On: 02/27/2024 05:32     Procedures   Medications Ordered in the ED  dexamethasone (DECADRON) tablet 10 mg (has no administration in time range)  albuterol  (VENTOLIN  HFA) 108 (90 Base) MCG/ACT inhaler 2 puff (2 puffs Inhalation Given 02/27/24 0541)    Clinical Course as of 02/27/24 0650  Wed Feb 27, 2024  0650 Updated patient.  Resting comfortably.  He ambulated with pulse ox and maintained his oxygenation.  Will treat for acute bronchitis with Decadron and an inhaler. [CH]    Clinical Course User Index [CH] Sha Amer, Charmaine FALCON, MD                                 Medical Decision Making Amount and/or Complexity of Data Reviewed Labs: ordered. Radiology: ordered.  Risk Prescription drug management.   This patient presents to the ED for concern of shortness of breath, fatigue, this involves an extensive number of treatment options, and is a complaint that carries with it a high risk of complications and morbidity.  I considered the following differential and admission for this acute, potentially life threatening condition.  The differential diagnosis includes postviral bronchitis, pneumonia, long COVID symptoms  MDM:    This is a 63 year old male who presents with concerns for generalized fatigue, shortness of breath, cough.  He states he has had the symptoms since having COVID 1 month ago.  He is an ongoing smoker.  He is nontoxic and vital signs are reassuring.  Breath sounds are clear although I have suspicion that he may have some ongoing bronchitis related to prior COVID.  Patient was given an inhaler.  Labs obtained and  reassuring without metabolic derangement.  No leukocytosis.  Chest x-ray without evidence of pneumonia.  Discussed results with the patient.  Low suspicion for other etiology such as PE.  Could be long COVID versus bronchitis.  Will treat with Decadron and an inhaler.  (Labs, imaging, consults)  Labs: I Ordered, and personally interpreted labs.  The pertinent results include: CBC, BMP  Imaging Studies ordered: I ordered imaging studies including chest x-ray I independently visualized and interpreted imaging. I agree with the radiologist interpretation  Additional history obtained from chart review.  External records from outside source obtained and reviewed including prior evaluations  Cardiac Monitoring: The patient was maintained on a cardiac  monitor.  If on the cardiac monitor, I personally viewed and interpreted the cardiac monitored which showed an underlying rhythm of: Sinus  Reevaluation: After the interventions noted above, I reevaluated the patient and found that they have :stayed the same  Social Determinants of Health:  lives independently  Disposition: Discharge  Co morbidities that complicate the patient evaluation  Past Medical History:  Diagnosis Date   Head injury    04/28/2019 -- 1st one - 15 yrs ago//2nd one - 12 yrs ago   Hyperlipidemia    Post concussion syndrome    Vertigo      Medicines Meds ordered this encounter  Medications   albuterol  (VENTOLIN  HFA) 108 (90 Base) MCG/ACT inhaler 2 puff   dexamethasone (DECADRON) tablet 10 mg   albuterol  (VENTOLIN  HFA) 108 (90 Base) MCG/ACT inhaler    Sig: Inhale 2 puffs into the lungs every 6 (six) hours as needed for wheezing or shortness of breath.    Dispense:  1 each    Refill:  0    I have reviewed the patients home medicines and have made adjustments as needed  Problem List / ED Course: Problem List Items Addressed This Visit   None Visit Diagnoses       Bronchitis    -  Primary                 Final diagnoses:  Bronchitis    ED Discharge Orders          Ordered    albuterol  (VENTOLIN  HFA) 108 (90 Base) MCG/ACT inhaler  Every 6 hours PRN        02/27/24 0649               Bari Charmaine FALCON, MD 02/27/24 [phone]

## 2024-02-27 NOTE — ED Notes (Signed)
 RT educated pt on proper use of MDI w/spacer. Pt able to perform w/out difficulty. Pt also educated on smoking cessation at this time. Pt verbalizes understanding of teaching.   02/27/24 0600  Aerosol Therapy Tx  $ Hand Held Nebulizer  1  Medications Albuterol   Delivery Device MDI  Pre-Treatment Pulse 68  Pre-Treatment Respirations 23  Treatment Tolerance Tolerated well  MEWS Score/Color  MEWS Score 1  MEWS Score Color Green  Oxygen Therapy/Pulse Ox  SpO2 98 %  O2 Device Room Air  O2 Therapy Room air
